# Patient Record
Sex: Male | Born: 1955 | Race: White | Hispanic: No | Marital: Single | Smoking: Never smoker
Health system: Southern US, Community
[De-identification: ages and names within clinical notes are randomized; demographics above are authoritative.]

## PROBLEM LIST (undated history)

## (undated) DIAGNOSIS — E119 Type 2 diabetes mellitus without complications: Secondary | ICD-10-CM

## (undated) DIAGNOSIS — Z8719 Personal history of other diseases of the digestive system: Secondary | ICD-10-CM

## (undated) DIAGNOSIS — Z9889 Other specified postprocedural states: Secondary | ICD-10-CM

## (undated) DIAGNOSIS — J45909 Unspecified asthma, uncomplicated: Secondary | ICD-10-CM

## (undated) DIAGNOSIS — K219 Gastro-esophageal reflux disease without esophagitis: Secondary | ICD-10-CM

## (undated) DIAGNOSIS — I10 Essential (primary) hypertension: Secondary | ICD-10-CM

## (undated) DIAGNOSIS — G4733 Obstructive sleep apnea (adult) (pediatric): Secondary | ICD-10-CM

## (undated) HISTORY — DX: Unspecified asthma, uncomplicated: J45.909

## (undated) HISTORY — DX: Essential (primary) hypertension: I10

## (undated) HISTORY — DX: Personal history of other diseases of the digestive system: Z87.19

## (undated) HISTORY — DX: Obstructive sleep apnea (adult) (pediatric): G47.33

## (undated) HISTORY — DX: Other specified postprocedural states: Z98.890

## (undated) HISTORY — DX: Type 2 diabetes mellitus without complications: E11.9

## (undated) HISTORY — DX: Gastro-esophageal reflux disease without esophagitis: K21.9

---

## 1996-08-29 HISTORY — PX: BACK SURGERY: SHX140

## 1998-10-30 ENCOUNTER — Ambulatory Visit (HOSPITAL_COMMUNITY): Admission: RE | Admit: 1998-10-30 | Discharge: 1998-10-30 | Payer: Self-pay | Admitting: Neurosurgery

## 1998-10-30 ENCOUNTER — Encounter: Payer: Self-pay | Admitting: Neurosurgery

## 2001-07-05 ENCOUNTER — Ambulatory Visit: Admission: RE | Admit: 2001-07-05 | Discharge: 2001-07-05 | Payer: Self-pay | Admitting: Pulmonary Disease

## 2001-07-05 ENCOUNTER — Encounter: Payer: Self-pay | Admitting: Pulmonary Disease

## 2001-11-27 ENCOUNTER — Emergency Department (HOSPITAL_COMMUNITY): Admission: EM | Admit: 2001-11-27 | Discharge: 2001-11-27 | Payer: Self-pay | Admitting: Emergency Medicine

## 2001-11-27 ENCOUNTER — Encounter: Payer: Self-pay | Admitting: Emergency Medicine

## 2005-08-29 HISTORY — PX: OTHER SURGICAL HISTORY: SHX169

## 2006-04-14 ENCOUNTER — Ambulatory Visit (HOSPITAL_COMMUNITY): Admission: RE | Admit: 2006-04-14 | Discharge: 2006-04-14 | Payer: Self-pay | Admitting: Neurosurgery

## 2007-06-25 IMAGING — CT CT L SPINE W/ CM
4 of 12 series · 10 of 33 positions shown, 11 images · non-contrast
Comparison: none

CLINICAL DATA: Fusion L4-5 and L5-S1.  Worsening back and hip pain.
 LUMBAR MYELOGRAM:
TECHNIQUE: Lumbar puncture was performed by Dr. Setzer.
TECHNIQUE: Multidetector CT imaging of the lumbar spine was performed after intrathecal injection of contrast.  Multiplanar CT image reconstructions were also generated.

[Series 2: l-spine helical · axial · 0.27mm/px · z∈[-316,-236]mm · 2 of 96 slices shown, 3 images]
[im 32/96  soft-tissue]
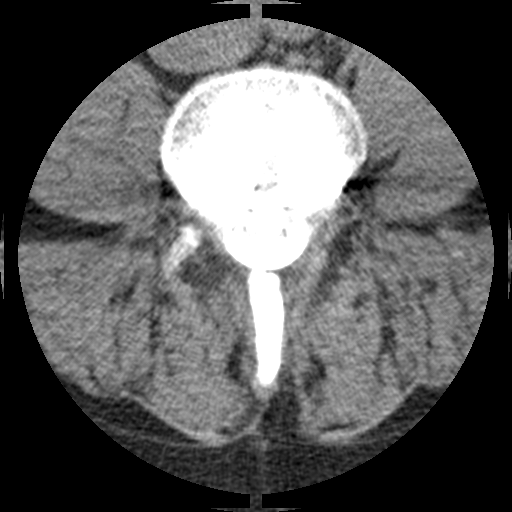
[im 32/96  bone]
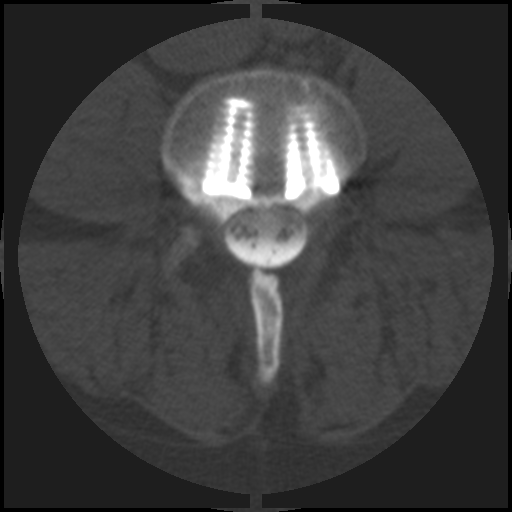
[im 64/96  bone]
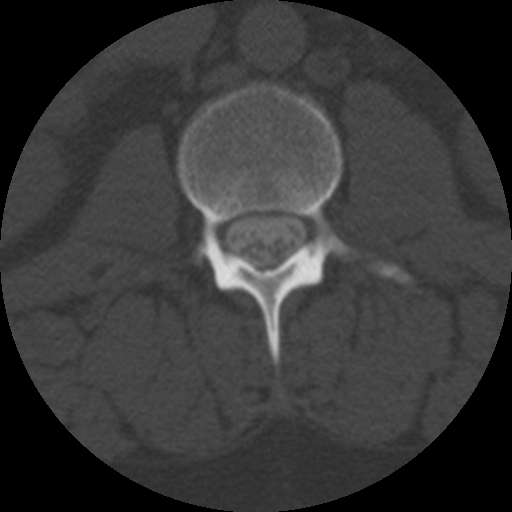

[Series 3: recon 2: l-spine helical · axial · 0.27mm/px · z∈[-316,-236]mm · 2 of 96 slices shown]
[im 32/96  bone]
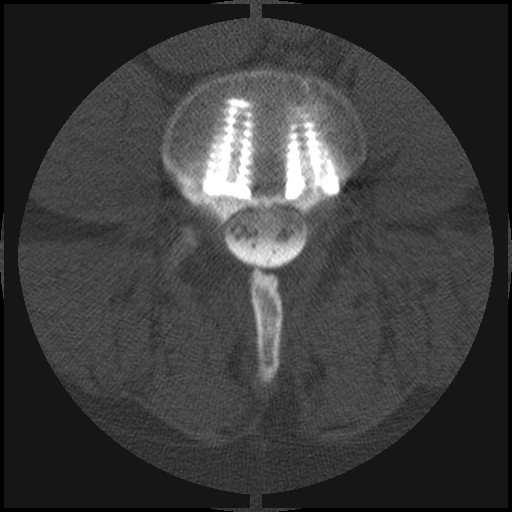
[im 64/96  bone]
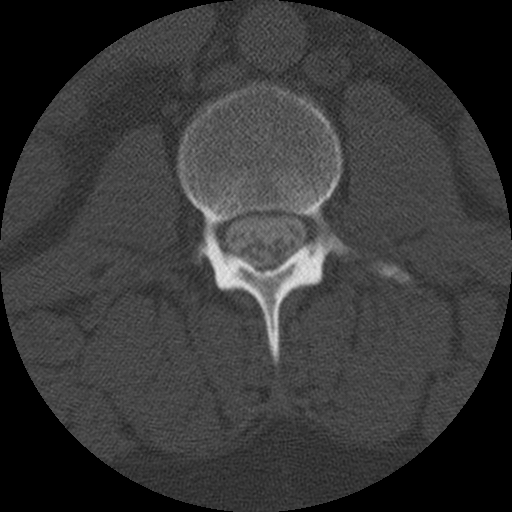

[Series 104: reformatted · coronal · 0.47mm/px · 3 of 67 slices shown (1 of 2)]
[im 17/67  bone]
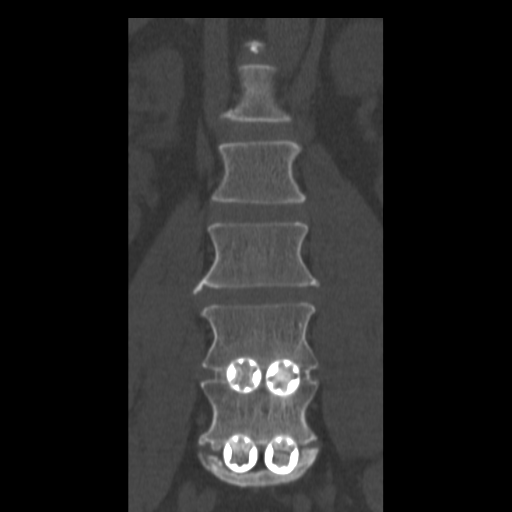
[im 34/67  bone]
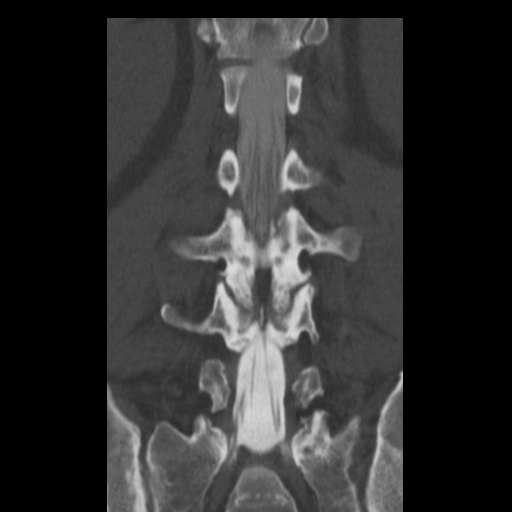
[im 50/67  bone]
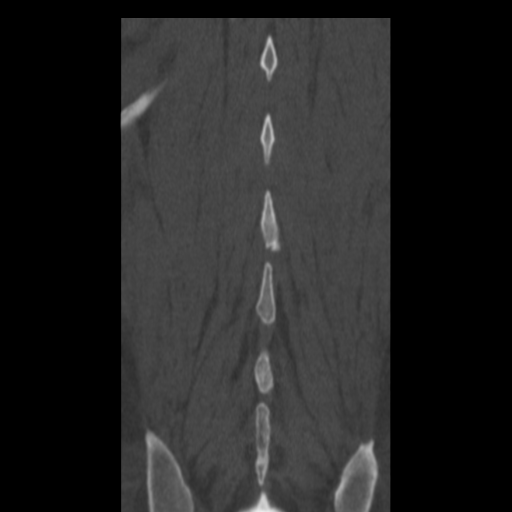

[Series 401: reformatted · coronal · 0.47mm/px · 3 of 56 slices shown (2 of 2)]
[im 12/56  bone]
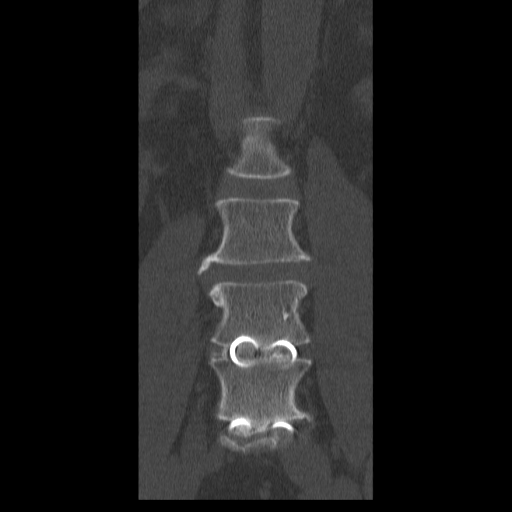
[im 23/56  bone]
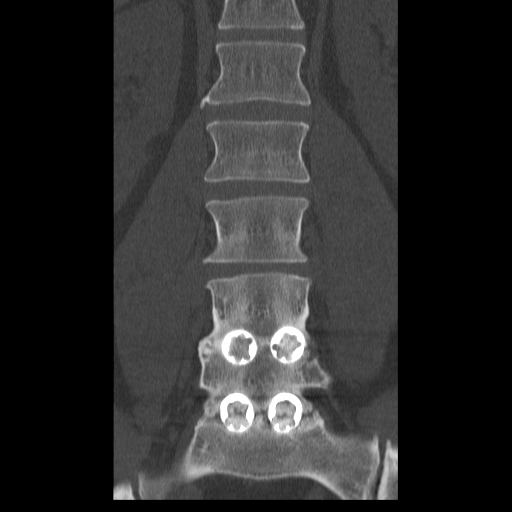
[im 34/56  bone]
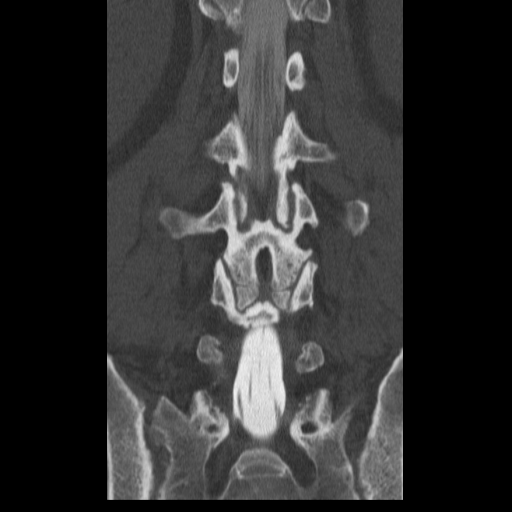

[10 of 33 positions shown; findings below may reference images not displayed]

FINDINGS: The patient has had cage fusion at L4-5 and L5-S1.  The spinal canal appears widely compressed at those levels.
 At L3-4, there is moderate stenosis, particularly affecting the lateral recesses, right more than left.  This appears to be due to encroachment from facet disease.
 Lesser facet degenerative changes are seen at L1-2 And L2-3 without definite stenosis.  
 Standing lateral flexion and extension views show perhaps slight rocking motion at the L3-4 level but are otherwise unremarkable.  The patient does not generate a great degree of bending.
IMPRESSION: 1.  Satisfactory appearance in fusion segment from L-4 to the sacrum.
 2.  Degenerative disk disease and degenerative facet disease at L3-4 with encroachment upon the lateral recesses, apparently primarily due to facet pathology.
 3.  Developing facet pathology also at L1-2 and L2-3, but without gross compressive stenosis.
 CT LUMBAR SPINE WITH CONTRAST (POST-MYELOGRAM):
FINDINGS: T12-L1:  Normal interspace.
 L1-2:  Disk appears normal. There is mild facet degeneration but no encroachment upon the canal or foramina.
 L2-3:  Minimal disk bulge.  Facet hypertrophy without encroachment upon the canal or foramina.
 L3-4:  The disk bulges moderately in a diffuse fashion.  There is facet overgrowth bilaterally with ligamentous calcification.  These findings are slightly more pronounced on the right. The lateral recesses are narrowed, more on the right than the left.  There is foraminal encroachment by facet osteophytes bilaterally.
 L4-5:  Solid fusion with wide patency of the canal and foramina.
 L5-S1:  Canal and foramina are widely patent. I can?t be sure there is solid fusion at this level.
IMPRESSION: 1.  Mild facet arthropathy at L1-2 and L2-3 but no stenosis
 2.  Marked facet arthropathy at L3-4 with facet overgrowth and ligamentous calcification having encroached upon the spinal canal. There is a moderate disk bulge as well.  Canal narrowing in the lateral recesses is more pronounced on the right. Neural foraminal encroachment bilaterally is about the same from right to left.
 3.  Good appearance of the fusion level of L4-5.
 4.  At the fusion level of L5-S1, there is wide patency of the canal and foramina.  I can?t be certain that there is solid fusion however.
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
## 2009-03-21 ENCOUNTER — Encounter: Payer: Self-pay | Admitting: Cardiology

## 2009-03-26 ENCOUNTER — Encounter: Payer: Self-pay | Admitting: Cardiology

## 2009-03-27 ENCOUNTER — Ambulatory Visit: Payer: Self-pay | Admitting: Cardiology

## 2009-04-02 ENCOUNTER — Encounter: Payer: Self-pay | Admitting: Cardiology

## 2009-05-18 ENCOUNTER — Ambulatory Visit: Payer: Self-pay | Admitting: Cardiology

## 2009-05-18 DIAGNOSIS — I10 Essential (primary) hypertension: Secondary | ICD-10-CM

## 2009-05-18 DIAGNOSIS — G4730 Sleep apnea, unspecified: Secondary | ICD-10-CM | POA: Insufficient documentation

## 2009-05-18 DIAGNOSIS — K219 Gastro-esophageal reflux disease without esophagitis: Secondary | ICD-10-CM

## 2009-05-18 DIAGNOSIS — J45909 Unspecified asthma, uncomplicated: Secondary | ICD-10-CM | POA: Insufficient documentation

## 2009-05-18 DIAGNOSIS — R072 Precordial pain: Secondary | ICD-10-CM | POA: Insufficient documentation

## 2009-05-18 DIAGNOSIS — E119 Type 2 diabetes mellitus without complications: Secondary | ICD-10-CM

## 2009-05-18 DIAGNOSIS — E669 Obesity, unspecified: Secondary | ICD-10-CM | POA: Insufficient documentation

## 2009-05-18 DIAGNOSIS — R9430 Abnormal result of cardiovascular function study, unspecified: Secondary | ICD-10-CM | POA: Insufficient documentation

## 2009-05-21 ENCOUNTER — Ambulatory Visit: Payer: Self-pay | Admitting: Cardiology

## 2009-05-21 ENCOUNTER — Encounter: Payer: Self-pay | Admitting: Cardiology

## 2009-05-25 ENCOUNTER — Encounter (INDEPENDENT_AMBULATORY_CARE_PROVIDER_SITE_OTHER): Payer: Self-pay | Admitting: *Deleted

## 2009-05-27 ENCOUNTER — Encounter: Payer: Self-pay | Admitting: Cardiology

## 2009-07-22 ENCOUNTER — Telehealth (INDEPENDENT_AMBULATORY_CARE_PROVIDER_SITE_OTHER): Payer: Self-pay | Admitting: *Deleted

## 2012-02-10 ENCOUNTER — Other Ambulatory Visit: Payer: Self-pay | Admitting: Cardiology

## 2012-02-10 ENCOUNTER — Encounter: Payer: Self-pay | Admitting: Cardiology

## 2012-03-14 ENCOUNTER — Encounter: Payer: Self-pay | Admitting: Cardiology

## 2012-04-24 ENCOUNTER — Encounter: Payer: Self-pay | Admitting: Cardiology

## 2012-04-24 ENCOUNTER — Telehealth: Payer: Self-pay

## 2012-04-24 ENCOUNTER — Encounter: Payer: Self-pay | Admitting: *Deleted

## 2012-04-24 ENCOUNTER — Other Ambulatory Visit: Payer: Self-pay | Admitting: Cardiology

## 2012-04-24 ENCOUNTER — Ambulatory Visit (INDEPENDENT_AMBULATORY_CARE_PROVIDER_SITE_OTHER): Payer: PRIVATE HEALTH INSURANCE | Admitting: Cardiology

## 2012-04-24 VITALS — BP 105/65 | HR 95 | Ht 71.0 in | Wt 277.0 lb

## 2012-04-24 DIAGNOSIS — R079 Chest pain, unspecified: Secondary | ICD-10-CM

## 2012-04-24 DIAGNOSIS — G4730 Sleep apnea, unspecified: Secondary | ICD-10-CM

## 2012-04-24 DIAGNOSIS — R072 Precordial pain: Secondary | ICD-10-CM

## 2012-04-24 DIAGNOSIS — I10 Essential (primary) hypertension: Secondary | ICD-10-CM

## 2012-04-24 NOTE — Progress Notes (Signed)
Clinical Summary [name] is a 56 y.o.male referred for cardiology consultation by Dr. Neita Bautista. He is a former patient of Dr. Antoine Bautista, last seen in 2010.   Previous cardiac testing includes an exercise Cardiolite from 2010 demonstrated no diagnostic ST segment changes with hypertensive response, hands probable normal perfusion with soft tissue attenuation, LVEF 46%. Echocardiography at that point however demonstrated normal LVEF of 55-60% with diastolic dysfunction.  He is referred due to an episode of chest pain that occurred back in June prompting an ER visit. He states that he had been out of his acid reducing medication, thought that it might be indigestion but was not sure. Troponin and BNP levels were normal. Chest x-ray reported no acute abnormalities. ECG showed sinus rhythm with no definite acute ST segment abnormalities in the setting of lead motion.  He states that symptoms have improved since then, particularly the last few weeks. He is now on Protonix.  He works [employer], walks outdoors frequently without recurring chest pain symptoms and NYHA class II dyspnea. He is still trying to work towards getting regular CPAP therapy at home.   Allergies  Allergen Reactions  . Penicillins     REACTION: hives  . Tape     Surgical tape    Current Outpatient Prescriptions  Medication Sig Dispense Refill  . ALPRAZolam (XANAX) 1 MG tablet Take 1 mg by mouth 4 (four) times daily as needed.       Marland Kitchen aspirin 81 MG tablet Take 81 mg by mouth daily.      . finasteride (PROSCAR) 5 MG tablet Take 1 tablet by mouth Daily.      Marland Kitchen HYDROcodone-acetaminophen (NORCO) 10-325 MG per tablet Take 1 tablet by mouth every 6 (six) hours as needed.      Marland Kitchen lisinopril (PRINIVIL,ZESTRIL) 10 MG tablet Take 1 tablet by mouth Daily.      . metFORMIN (GLUCOPHAGE) 500 MG tablet Take 1,500 mg by mouth daily with breakfast.       . nabumetone (RELAFEN) 750 MG tablet Take 1 tablet by mouth Twice daily.      Marland Kitchen  NITROSTAT 0.4 MG SL tablet Place 1 tablet under the tongue every 5 (five) minutes x 3 doses as needed.      Marland Kitchen omeprazole (PRILOSEC) 20 MG capsule Take 20 mg by mouth daily.      Marland Kitchen terazosin (HYTRIN) 10 MG capsule Take 10 mg by mouth at bedtime.        Past Medical History  Diagnosis Date  . Asthma   . Essential hypertension, benign   . Type 2 diabetes mellitus   . GERD (gastroesophageal reflux disease)   . S/P dilatation of esophageal stricture   . OSA (obstructive sleep apnea)     Past Surgical History  Procedure Date  . Right thr 2007  . Back surgery 1998    Family History  Problem Relation Age of Onset  . Heart failure Father   . Cancer Father   . Valvular heart disease Mother     Social History [name] reports that he has never smoked. He has never used smokeless tobacco. [name] reports that he does not drink alcohol.  Review of Systems No palpitations, orthopnea, PND, syncope. Has occasional leg pain, not claudication by description. Otherwise negative.  Physical Examination Filed Vitals:   04/24/12 1500  BP: 105/65  Pulse: 95   Obese male in no acute distress. HEENT: Conjunctiva and lids normal, oropharynx clear. Neck: Supple, increased girth,  no obvious elevated JVP or carotid bruits, no thyromegaly. Lungs: Clear to auscultation, decreased BS, nonlabored breathing at rest. Cardiac: Regular rate and rhythm, no S3 or significant systolic murmur, no pericardial rub. Abdomen: Soft, protuberant, nontender, bowel sounds present, no guarding or rebound. Extremities: Trace edema, distal pulses 2+. Skin: Warm and dry. Musculoskeletal: No kyphosis. Neuropsychiatric: Alert and oriented x3, affect grossly appropriate.   Problem List and Plan   PRECORDIAL PAIN Possibly reflux based on description and resolution on PPI. He does have ongoing cardiac risk factors however including obesity, diabetes, hyperlipidemia, and hypertension. No objective ischemic testing in  the last 3 years. Recent ECG is nonspecific with normal cardiac markers. We will arrange an exercise echocardiogram for further evaluation and inform him of the results.  Essential hypertension, benign Blood pressure is normal today.  SLEEP APNEA I encouraged him to contact Dr. Neita Bautista so than his CPAP is functional and ready to use.    Alex Bautista, M.D., F.A.C.C.

## 2012-04-24 NOTE — Telephone Encounter (Signed)
Stress Echo scheduled for 05-02-12 @ [facility] Checking percert

## 2012-04-24 NOTE — Patient Instructions (Addendum)
Your physician recommends that you continue on your current medications as directed. Please refer to the Current Medication list given to you today.  Your physician has requested that you have a stress echocardiogram. For further information please visit [email or web address] Please follow instruction sheet as given. We will call you with the results.

## 2012-04-24 NOTE — Assessment & Plan Note (Signed)
I encouraged him to contact Dr. Neita Carp so than his CPAP is functional and ready to use.

## 2012-04-24 NOTE — Assessment & Plan Note (Signed)
Possibly reflux based on description and resolution on PPI. He does have ongoing cardiac risk factors however including obesity, diabetes, hyperlipidemia, and hypertension. No objective ischemic testing in the last 3 years. Recent ECG is nonspecific with normal cardiac markers. We will arrange an exercise echocardiogram for further evaluation and inform him of the results.

## 2012-04-24 NOTE — Assessment & Plan Note (Signed)
Blood pressure is normal today. 

## 2012-04-25 NOTE — Telephone Encounter (Signed)
Pt has [employer] PFFS.  Per Jan H, [phone], no precert required.

## 2012-05-02 DIAGNOSIS — R072 Precordial pain: Secondary | ICD-10-CM

## 2012-05-10 ENCOUNTER — Telehealth: Payer: Self-pay | Admitting: *Deleted

## 2012-05-10 NOTE — Telephone Encounter (Signed)
Patient informed. 

## 2012-05-10 NOTE — Telephone Encounter (Signed)
Message copied by Eustace Moore on Thu May 10, 2012  1:17 PM ------      Message from: MCDOWELL, Illene Bolus      Created: Thu May 10, 2012  8:21 AM       Reviewed. Reassuring study without definite evidence of obstructive CAD.

## 2014-09-09 DIAGNOSIS — R252 Cramp and spasm: Secondary | ICD-10-CM | POA: Diagnosis not present

## 2014-10-14 DIAGNOSIS — F333 Major depressive disorder, recurrent, severe with psychotic symptoms: Secondary | ICD-10-CM | POA: Diagnosis not present

## 2014-10-14 DIAGNOSIS — Z5181 Encounter for therapeutic drug level monitoring: Secondary | ICD-10-CM | POA: Diagnosis not present

## 2014-11-06 DIAGNOSIS — I10 Essential (primary) hypertension: Secondary | ICD-10-CM | POA: Diagnosis not present

## 2014-11-06 DIAGNOSIS — E1165 Type 2 diabetes mellitus with hyperglycemia: Secondary | ICD-10-CM | POA: Diagnosis not present

## 2014-11-06 DIAGNOSIS — E782 Mixed hyperlipidemia: Secondary | ICD-10-CM | POA: Diagnosis not present

## 2014-11-06 DIAGNOSIS — K210 Gastro-esophageal reflux disease with esophagitis: Secondary | ICD-10-CM | POA: Diagnosis not present

## 2014-11-13 DIAGNOSIS — G4733 Obstructive sleep apnea (adult) (pediatric): Secondary | ICD-10-CM | POA: Diagnosis not present

## 2014-11-13 DIAGNOSIS — Z1389 Encounter for screening for other disorder: Secondary | ICD-10-CM | POA: Diagnosis not present

## 2014-11-13 DIAGNOSIS — E1165 Type 2 diabetes mellitus with hyperglycemia: Secondary | ICD-10-CM | POA: Diagnosis not present

## 2014-11-13 DIAGNOSIS — E782 Mixed hyperlipidemia: Secondary | ICD-10-CM | POA: Diagnosis not present

## 2014-11-13 DIAGNOSIS — M545 Low back pain: Secondary | ICD-10-CM | POA: Diagnosis not present

## 2014-11-13 DIAGNOSIS — R252 Cramp and spasm: Secondary | ICD-10-CM | POA: Diagnosis not present

## 2014-11-13 DIAGNOSIS — R911 Solitary pulmonary nodule: Secondary | ICD-10-CM | POA: Diagnosis not present

## 2014-11-13 DIAGNOSIS — I10 Essential (primary) hypertension: Secondary | ICD-10-CM | POA: Diagnosis not present

## 2015-01-29 DIAGNOSIS — H10022 Other mucopurulent conjunctivitis, left eye: Secondary | ICD-10-CM | POA: Diagnosis not present

## 2015-01-29 DIAGNOSIS — M79671 Pain in right foot: Secondary | ICD-10-CM | POA: Diagnosis not present

## 2015-01-29 DIAGNOSIS — M25562 Pain in left knee: Secondary | ICD-10-CM | POA: Diagnosis not present

## 2015-02-04 DIAGNOSIS — F319 Bipolar disorder, unspecified: Secondary | ICD-10-CM | POA: Diagnosis not present

## 2015-03-16 DIAGNOSIS — I10 Essential (primary) hypertension: Secondary | ICD-10-CM | POA: Diagnosis not present

## 2015-03-16 DIAGNOSIS — E780 Pure hypercholesterolemia: Secondary | ICD-10-CM | POA: Diagnosis not present

## 2015-03-16 DIAGNOSIS — E782 Mixed hyperlipidemia: Secondary | ICD-10-CM | POA: Diagnosis not present

## 2015-03-16 DIAGNOSIS — E1165 Type 2 diabetes mellitus with hyperglycemia: Secondary | ICD-10-CM | POA: Diagnosis not present

## 2015-03-23 DIAGNOSIS — R252 Cramp and spasm: Secondary | ICD-10-CM | POA: Diagnosis not present

## 2015-03-23 DIAGNOSIS — R911 Solitary pulmonary nodule: Secondary | ICD-10-CM | POA: Diagnosis not present

## 2015-03-23 DIAGNOSIS — I10 Essential (primary) hypertension: Secondary | ICD-10-CM | POA: Diagnosis not present

## 2015-03-23 DIAGNOSIS — M545 Low back pain: Secondary | ICD-10-CM | POA: Diagnosis not present

## 2015-03-23 DIAGNOSIS — Z23 Encounter for immunization: Secondary | ICD-10-CM | POA: Diagnosis not present

## 2015-03-23 DIAGNOSIS — G4733 Obstructive sleep apnea (adult) (pediatric): Secondary | ICD-10-CM | POA: Diagnosis not present

## 2015-03-23 DIAGNOSIS — E782 Mixed hyperlipidemia: Secondary | ICD-10-CM | POA: Diagnosis not present

## 2015-07-20 DIAGNOSIS — F411 Generalized anxiety disorder: Secondary | ICD-10-CM | POA: Diagnosis not present

## 2015-07-20 DIAGNOSIS — E1129 Type 2 diabetes mellitus with other diabetic kidney complication: Secondary | ICD-10-CM | POA: Diagnosis not present

## 2015-07-20 DIAGNOSIS — E782 Mixed hyperlipidemia: Secondary | ICD-10-CM | POA: Diagnosis not present

## 2015-07-20 DIAGNOSIS — E7800 Pure hypercholesterolemia, unspecified: Secondary | ICD-10-CM | POA: Diagnosis not present

## 2015-07-20 DIAGNOSIS — I10 Essential (primary) hypertension: Secondary | ICD-10-CM | POA: Diagnosis not present

## 2015-07-27 DIAGNOSIS — M545 Low back pain: Secondary | ICD-10-CM | POA: Diagnosis not present

## 2015-07-27 DIAGNOSIS — G2581 Restless legs syndrome: Secondary | ICD-10-CM | POA: Diagnosis not present

## 2015-07-27 DIAGNOSIS — E782 Mixed hyperlipidemia: Secondary | ICD-10-CM | POA: Diagnosis not present

## 2015-07-27 DIAGNOSIS — G4733 Obstructive sleep apnea (adult) (pediatric): Secondary | ICD-10-CM | POA: Diagnosis not present

## 2015-07-27 DIAGNOSIS — I10 Essential (primary) hypertension: Secondary | ICD-10-CM | POA: Diagnosis not present

## 2015-07-27 DIAGNOSIS — R252 Cramp and spasm: Secondary | ICD-10-CM | POA: Diagnosis not present

## 2015-07-27 DIAGNOSIS — R911 Solitary pulmonary nodule: Secondary | ICD-10-CM | POA: Diagnosis not present

## 2015-08-05 DIAGNOSIS — F333 Major depressive disorder, recurrent, severe with psychotic symptoms: Secondary | ICD-10-CM | POA: Diagnosis not present

## 2015-10-21 DIAGNOSIS — I10 Essential (primary) hypertension: Secondary | ICD-10-CM | POA: Diagnosis not present

## 2015-10-21 DIAGNOSIS — E782 Mixed hyperlipidemia: Secondary | ICD-10-CM | POA: Diagnosis not present

## 2015-10-21 DIAGNOSIS — E1129 Type 2 diabetes mellitus with other diabetic kidney complication: Secondary | ICD-10-CM | POA: Diagnosis not present

## 2015-10-21 DIAGNOSIS — E7800 Pure hypercholesterolemia, unspecified: Secondary | ICD-10-CM | POA: Diagnosis not present

## 2015-10-26 DIAGNOSIS — R252 Cramp and spasm: Secondary | ICD-10-CM | POA: Diagnosis not present

## 2015-10-26 DIAGNOSIS — R911 Solitary pulmonary nodule: Secondary | ICD-10-CM | POA: Diagnosis not present

## 2015-10-26 DIAGNOSIS — F411 Generalized anxiety disorder: Secondary | ICD-10-CM | POA: Diagnosis not present

## 2015-10-26 DIAGNOSIS — G4733 Obstructive sleep apnea (adult) (pediatric): Secondary | ICD-10-CM | POA: Diagnosis not present

## 2015-10-26 DIAGNOSIS — I10 Essential (primary) hypertension: Secondary | ICD-10-CM | POA: Diagnosis not present

## 2015-10-26 DIAGNOSIS — M545 Low back pain: Secondary | ICD-10-CM | POA: Diagnosis not present

## 2015-10-26 DIAGNOSIS — E782 Mixed hyperlipidemia: Secondary | ICD-10-CM | POA: Diagnosis not present

## 2015-10-26 DIAGNOSIS — G2581 Restless legs syndrome: Secondary | ICD-10-CM | POA: Diagnosis not present

## 2016-02-11 DIAGNOSIS — F319 Bipolar disorder, unspecified: Secondary | ICD-10-CM | POA: Diagnosis not present

## 2016-03-02 DIAGNOSIS — E1165 Type 2 diabetes mellitus with hyperglycemia: Secondary | ICD-10-CM | POA: Diagnosis not present

## 2016-03-02 DIAGNOSIS — K210 Gastro-esophageal reflux disease with esophagitis: Secondary | ICD-10-CM | POA: Diagnosis not present

## 2016-03-02 DIAGNOSIS — E782 Mixed hyperlipidemia: Secondary | ICD-10-CM | POA: Diagnosis not present

## 2016-03-02 DIAGNOSIS — I10 Essential (primary) hypertension: Secondary | ICD-10-CM | POA: Diagnosis not present

## 2016-03-07 DIAGNOSIS — G4733 Obstructive sleep apnea (adult) (pediatric): Secondary | ICD-10-CM | POA: Diagnosis not present

## 2016-03-07 DIAGNOSIS — Z6841 Body Mass Index (BMI) 40.0 and over, adult: Secondary | ICD-10-CM | POA: Diagnosis not present

## 2016-03-07 DIAGNOSIS — R911 Solitary pulmonary nodule: Secondary | ICD-10-CM | POA: Diagnosis not present

## 2016-03-07 DIAGNOSIS — E782 Mixed hyperlipidemia: Secondary | ICD-10-CM | POA: Diagnosis not present

## 2016-03-07 DIAGNOSIS — M545 Low back pain: Secondary | ICD-10-CM | POA: Diagnosis not present

## 2016-03-07 DIAGNOSIS — R252 Cramp and spasm: Secondary | ICD-10-CM | POA: Diagnosis not present

## 2016-03-07 DIAGNOSIS — I10 Essential (primary) hypertension: Secondary | ICD-10-CM | POA: Diagnosis not present

## 2016-06-01 DIAGNOSIS — E7800 Pure hypercholesterolemia, unspecified: Secondary | ICD-10-CM | POA: Diagnosis not present

## 2016-06-01 DIAGNOSIS — E1129 Type 2 diabetes mellitus with other diabetic kidney complication: Secondary | ICD-10-CM | POA: Diagnosis not present

## 2016-06-01 DIAGNOSIS — E782 Mixed hyperlipidemia: Secondary | ICD-10-CM | POA: Diagnosis not present

## 2016-06-01 DIAGNOSIS — F411 Generalized anxiety disorder: Secondary | ICD-10-CM | POA: Diagnosis not present

## 2016-06-01 DIAGNOSIS — K210 Gastro-esophageal reflux disease with esophagitis: Secondary | ICD-10-CM | POA: Diagnosis not present

## 2016-06-01 DIAGNOSIS — E1140 Type 2 diabetes mellitus with diabetic neuropathy, unspecified: Secondary | ICD-10-CM | POA: Diagnosis not present

## 2016-06-06 DIAGNOSIS — F411 Generalized anxiety disorder: Secondary | ICD-10-CM | POA: Diagnosis not present

## 2016-06-06 DIAGNOSIS — E782 Mixed hyperlipidemia: Secondary | ICD-10-CM | POA: Diagnosis not present

## 2016-06-06 DIAGNOSIS — M545 Low back pain: Secondary | ICD-10-CM | POA: Diagnosis not present

## 2016-06-06 DIAGNOSIS — G4733 Obstructive sleep apnea (adult) (pediatric): Secondary | ICD-10-CM | POA: Diagnosis not present

## 2016-06-06 DIAGNOSIS — R252 Cramp and spasm: Secondary | ICD-10-CM | POA: Diagnosis not present

## 2016-06-06 DIAGNOSIS — G2581 Restless legs syndrome: Secondary | ICD-10-CM | POA: Diagnosis not present

## 2016-06-06 DIAGNOSIS — R911 Solitary pulmonary nodule: Secondary | ICD-10-CM | POA: Diagnosis not present

## 2016-06-06 DIAGNOSIS — I10 Essential (primary) hypertension: Secondary | ICD-10-CM | POA: Diagnosis not present

## 2016-06-22 DIAGNOSIS — F319 Bipolar disorder, unspecified: Secondary | ICD-10-CM | POA: Diagnosis not present

## 2016-08-25 DIAGNOSIS — I10 Essential (primary) hypertension: Secondary | ICD-10-CM | POA: Diagnosis not present

## 2016-08-25 DIAGNOSIS — E782 Mixed hyperlipidemia: Secondary | ICD-10-CM | POA: Diagnosis not present

## 2016-08-25 DIAGNOSIS — K210 Gastro-esophageal reflux disease with esophagitis: Secondary | ICD-10-CM | POA: Diagnosis not present

## 2016-08-25 DIAGNOSIS — E1165 Type 2 diabetes mellitus with hyperglycemia: Secondary | ICD-10-CM | POA: Diagnosis not present

## 2016-08-30 DIAGNOSIS — M545 Low back pain: Secondary | ICD-10-CM | POA: Diagnosis not present

## 2016-08-30 DIAGNOSIS — R911 Solitary pulmonary nodule: Secondary | ICD-10-CM | POA: Diagnosis not present

## 2016-08-30 DIAGNOSIS — G4733 Obstructive sleep apnea (adult) (pediatric): Secondary | ICD-10-CM | POA: Diagnosis not present

## 2016-08-30 DIAGNOSIS — E782 Mixed hyperlipidemia: Secondary | ICD-10-CM | POA: Diagnosis not present

## 2016-08-30 DIAGNOSIS — I10 Essential (primary) hypertension: Secondary | ICD-10-CM | POA: Diagnosis not present

## 2016-08-30 DIAGNOSIS — R252 Cramp and spasm: Secondary | ICD-10-CM | POA: Diagnosis not present

## 2016-08-30 DIAGNOSIS — Z6841 Body Mass Index (BMI) 40.0 and over, adult: Secondary | ICD-10-CM | POA: Diagnosis not present

## 2016-11-28 DIAGNOSIS — E1165 Type 2 diabetes mellitus with hyperglycemia: Secondary | ICD-10-CM | POA: Diagnosis not present

## 2016-11-28 DIAGNOSIS — K210 Gastro-esophageal reflux disease with esophagitis: Secondary | ICD-10-CM | POA: Diagnosis not present

## 2016-11-28 DIAGNOSIS — I10 Essential (primary) hypertension: Secondary | ICD-10-CM | POA: Diagnosis not present

## 2016-11-28 DIAGNOSIS — E782 Mixed hyperlipidemia: Secondary | ICD-10-CM | POA: Diagnosis not present

## 2016-11-30 DIAGNOSIS — R252 Cramp and spasm: Secondary | ICD-10-CM | POA: Diagnosis not present

## 2016-11-30 DIAGNOSIS — I10 Essential (primary) hypertension: Secondary | ICD-10-CM | POA: Diagnosis not present

## 2016-11-30 DIAGNOSIS — R911 Solitary pulmonary nodule: Secondary | ICD-10-CM | POA: Diagnosis not present

## 2016-11-30 DIAGNOSIS — M545 Low back pain: Secondary | ICD-10-CM | POA: Diagnosis not present

## 2016-11-30 DIAGNOSIS — G4733 Obstructive sleep apnea (adult) (pediatric): Secondary | ICD-10-CM | POA: Diagnosis not present

## 2016-11-30 DIAGNOSIS — G2581 Restless legs syndrome: Secondary | ICD-10-CM | POA: Diagnosis not present

## 2016-11-30 DIAGNOSIS — E782 Mixed hyperlipidemia: Secondary | ICD-10-CM | POA: Diagnosis not present

## 2017-02-13 DIAGNOSIS — Z6841 Body Mass Index (BMI) 40.0 and over, adult: Secondary | ICD-10-CM | POA: Diagnosis not present

## 2017-02-13 DIAGNOSIS — L259 Unspecified contact dermatitis, unspecified cause: Secondary | ICD-10-CM | POA: Diagnosis not present

## 2017-02-13 DIAGNOSIS — B372 Candidiasis of skin and nail: Secondary | ICD-10-CM | POA: Diagnosis not present

## 2017-02-21 DIAGNOSIS — G4733 Obstructive sleep apnea (adult) (pediatric): Secondary | ICD-10-CM | POA: Diagnosis not present

## 2017-02-21 DIAGNOSIS — E782 Mixed hyperlipidemia: Secondary | ICD-10-CM | POA: Diagnosis not present

## 2017-02-21 DIAGNOSIS — F329 Major depressive disorder, single episode, unspecified: Secondary | ICD-10-CM | POA: Diagnosis not present

## 2017-02-21 DIAGNOSIS — E1140 Type 2 diabetes mellitus with diabetic neuropathy, unspecified: Secondary | ICD-10-CM | POA: Diagnosis not present

## 2017-02-21 DIAGNOSIS — E7800 Pure hypercholesterolemia, unspecified: Secondary | ICD-10-CM | POA: Diagnosis not present

## 2017-02-21 DIAGNOSIS — E1165 Type 2 diabetes mellitus with hyperglycemia: Secondary | ICD-10-CM | POA: Diagnosis not present

## 2017-02-21 DIAGNOSIS — K210 Gastro-esophageal reflux disease with esophagitis: Secondary | ICD-10-CM | POA: Diagnosis not present

## 2017-02-21 DIAGNOSIS — E1129 Type 2 diabetes mellitus with other diabetic kidney complication: Secondary | ICD-10-CM | POA: Diagnosis not present

## 2017-02-24 DIAGNOSIS — R0789 Other chest pain: Secondary | ICD-10-CM | POA: Diagnosis not present

## 2017-02-24 DIAGNOSIS — Z7984 Long term (current) use of oral hypoglycemic drugs: Secondary | ICD-10-CM | POA: Diagnosis not present

## 2017-02-24 DIAGNOSIS — I10 Essential (primary) hypertension: Secondary | ICD-10-CM | POA: Diagnosis not present

## 2017-02-24 DIAGNOSIS — E119 Type 2 diabetes mellitus without complications: Secondary | ICD-10-CM | POA: Diagnosis not present

## 2017-02-24 DIAGNOSIS — R109 Unspecified abdominal pain: Secondary | ICD-10-CM | POA: Diagnosis not present

## 2017-02-24 DIAGNOSIS — R112 Nausea with vomiting, unspecified: Secondary | ICD-10-CM | POA: Diagnosis not present

## 2017-02-24 DIAGNOSIS — R079 Chest pain, unspecified: Secondary | ICD-10-CM | POA: Diagnosis not present

## 2017-02-24 DIAGNOSIS — R000 Tachycardia, unspecified: Secondary | ICD-10-CM | POA: Diagnosis not present

## 2017-02-24 DIAGNOSIS — R252 Cramp and spasm: Secondary | ICD-10-CM | POA: Diagnosis not present

## 2017-02-24 DIAGNOSIS — Z79899 Other long term (current) drug therapy: Secondary | ICD-10-CM | POA: Diagnosis not present

## 2017-02-27 DIAGNOSIS — Z6841 Body Mass Index (BMI) 40.0 and over, adult: Secondary | ICD-10-CM | POA: Diagnosis not present

## 2017-02-27 DIAGNOSIS — R252 Cramp and spasm: Secondary | ICD-10-CM | POA: Diagnosis not present

## 2017-02-27 DIAGNOSIS — E782 Mixed hyperlipidemia: Secondary | ICD-10-CM | POA: Diagnosis not present

## 2017-02-27 DIAGNOSIS — Z0000 Encounter for general adult medical examination without abnormal findings: Secondary | ICD-10-CM | POA: Diagnosis not present

## 2017-02-27 DIAGNOSIS — Z23 Encounter for immunization: Secondary | ICD-10-CM | POA: Diagnosis not present

## 2017-02-27 DIAGNOSIS — I10 Essential (primary) hypertension: Secondary | ICD-10-CM | POA: Diagnosis not present

## 2017-02-27 DIAGNOSIS — R911 Solitary pulmonary nodule: Secondary | ICD-10-CM | POA: Diagnosis not present

## 2017-05-18 DIAGNOSIS — F333 Major depressive disorder, recurrent, severe with psychotic symptoms: Secondary | ICD-10-CM | POA: Diagnosis not present

## 2017-06-07 DIAGNOSIS — E1140 Type 2 diabetes mellitus with diabetic neuropathy, unspecified: Secondary | ICD-10-CM | POA: Diagnosis not present

## 2017-06-07 DIAGNOSIS — G4733 Obstructive sleep apnea (adult) (pediatric): Secondary | ICD-10-CM | POA: Diagnosis not present

## 2017-06-07 DIAGNOSIS — E782 Mixed hyperlipidemia: Secondary | ICD-10-CM | POA: Diagnosis not present

## 2017-06-07 DIAGNOSIS — K210 Gastro-esophageal reflux disease with esophagitis: Secondary | ICD-10-CM | POA: Diagnosis not present

## 2017-06-07 DIAGNOSIS — G2581 Restless legs syndrome: Secondary | ICD-10-CM | POA: Diagnosis not present

## 2017-06-07 DIAGNOSIS — E7800 Pure hypercholesterolemia, unspecified: Secondary | ICD-10-CM | POA: Diagnosis not present

## 2017-06-07 DIAGNOSIS — E1129 Type 2 diabetes mellitus with other diabetic kidney complication: Secondary | ICD-10-CM | POA: Diagnosis not present

## 2017-06-07 DIAGNOSIS — I10 Essential (primary) hypertension: Secondary | ICD-10-CM | POA: Diagnosis not present

## 2017-06-07 DIAGNOSIS — E1165 Type 2 diabetes mellitus with hyperglycemia: Secondary | ICD-10-CM | POA: Diagnosis not present

## 2017-06-07 DIAGNOSIS — F411 Generalized anxiety disorder: Secondary | ICD-10-CM | POA: Diagnosis not present

## 2017-06-14 DIAGNOSIS — M545 Low back pain: Secondary | ICD-10-CM | POA: Diagnosis not present

## 2017-06-14 DIAGNOSIS — G2581 Restless legs syndrome: Secondary | ICD-10-CM | POA: Diagnosis not present

## 2017-06-14 DIAGNOSIS — Z6841 Body Mass Index (BMI) 40.0 and over, adult: Secondary | ICD-10-CM | POA: Diagnosis not present

## 2017-06-14 DIAGNOSIS — I10 Essential (primary) hypertension: Secondary | ICD-10-CM | POA: Diagnosis not present

## 2017-06-14 DIAGNOSIS — R252 Cramp and spasm: Secondary | ICD-10-CM | POA: Diagnosis not present

## 2017-06-14 DIAGNOSIS — R911 Solitary pulmonary nodule: Secondary | ICD-10-CM | POA: Diagnosis not present

## 2017-06-14 DIAGNOSIS — Z23 Encounter for immunization: Secondary | ICD-10-CM | POA: Diagnosis not present

## 2017-06-14 DIAGNOSIS — E782 Mixed hyperlipidemia: Secondary | ICD-10-CM | POA: Diagnosis not present

## 2017-09-11 DIAGNOSIS — E782 Mixed hyperlipidemia: Secondary | ICD-10-CM | POA: Diagnosis not present

## 2017-09-11 DIAGNOSIS — E1165 Type 2 diabetes mellitus with hyperglycemia: Secondary | ICD-10-CM | POA: Diagnosis not present

## 2017-09-11 DIAGNOSIS — I10 Essential (primary) hypertension: Secondary | ICD-10-CM | POA: Diagnosis not present

## 2017-09-11 DIAGNOSIS — K210 Gastro-esophageal reflux disease with esophagitis: Secondary | ICD-10-CM | POA: Diagnosis not present

## 2017-09-13 DIAGNOSIS — M545 Low back pain: Secondary | ICD-10-CM | POA: Diagnosis not present

## 2017-09-13 DIAGNOSIS — G4733 Obstructive sleep apnea (adult) (pediatric): Secondary | ICD-10-CM | POA: Diagnosis not present

## 2017-09-13 DIAGNOSIS — R911 Solitary pulmonary nodule: Secondary | ICD-10-CM | POA: Diagnosis not present

## 2017-09-13 DIAGNOSIS — I10 Essential (primary) hypertension: Secondary | ICD-10-CM | POA: Diagnosis not present

## 2017-09-13 DIAGNOSIS — E782 Mixed hyperlipidemia: Secondary | ICD-10-CM | POA: Diagnosis not present

## 2017-09-13 DIAGNOSIS — Z6841 Body Mass Index (BMI) 40.0 and over, adult: Secondary | ICD-10-CM | POA: Diagnosis not present

## 2017-09-13 DIAGNOSIS — R252 Cramp and spasm: Secondary | ICD-10-CM | POA: Diagnosis not present

## 2017-09-28 DIAGNOSIS — F333 Major depressive disorder, recurrent, severe with psychotic symptoms: Secondary | ICD-10-CM | POA: Diagnosis not present

## 2017-12-06 DIAGNOSIS — E782 Mixed hyperlipidemia: Secondary | ICD-10-CM | POA: Diagnosis not present

## 2017-12-06 DIAGNOSIS — G2581 Restless legs syndrome: Secondary | ICD-10-CM | POA: Diagnosis not present

## 2017-12-06 DIAGNOSIS — E1129 Type 2 diabetes mellitus with other diabetic kidney complication: Secondary | ICD-10-CM | POA: Diagnosis not present

## 2017-12-06 DIAGNOSIS — E7800 Pure hypercholesterolemia, unspecified: Secondary | ICD-10-CM | POA: Diagnosis not present

## 2017-12-06 DIAGNOSIS — F411 Generalized anxiety disorder: Secondary | ICD-10-CM | POA: Diagnosis not present

## 2017-12-06 DIAGNOSIS — E1140 Type 2 diabetes mellitus with diabetic neuropathy, unspecified: Secondary | ICD-10-CM | POA: Diagnosis not present

## 2017-12-06 DIAGNOSIS — G4733 Obstructive sleep apnea (adult) (pediatric): Secondary | ICD-10-CM | POA: Diagnosis not present

## 2017-12-06 DIAGNOSIS — K210 Gastro-esophageal reflux disease with esophagitis: Secondary | ICD-10-CM | POA: Diagnosis not present

## 2017-12-06 DIAGNOSIS — E1165 Type 2 diabetes mellitus with hyperglycemia: Secondary | ICD-10-CM | POA: Diagnosis not present

## 2017-12-06 DIAGNOSIS — I10 Essential (primary) hypertension: Secondary | ICD-10-CM | POA: Diagnosis not present

## 2017-12-12 DIAGNOSIS — E782 Mixed hyperlipidemia: Secondary | ICD-10-CM | POA: Diagnosis not present

## 2017-12-12 DIAGNOSIS — I10 Essential (primary) hypertension: Secondary | ICD-10-CM | POA: Diagnosis not present

## 2017-12-12 DIAGNOSIS — R252 Cramp and spasm: Secondary | ICD-10-CM | POA: Diagnosis not present

## 2017-12-12 DIAGNOSIS — G4733 Obstructive sleep apnea (adult) (pediatric): Secondary | ICD-10-CM | POA: Diagnosis not present

## 2017-12-12 DIAGNOSIS — M545 Low back pain: Secondary | ICD-10-CM | POA: Diagnosis not present

## 2017-12-12 DIAGNOSIS — R911 Solitary pulmonary nodule: Secondary | ICD-10-CM | POA: Diagnosis not present

## 2017-12-12 DIAGNOSIS — Z6841 Body Mass Index (BMI) 40.0 and over, adult: Secondary | ICD-10-CM | POA: Diagnosis not present

## 2018-01-31 DIAGNOSIS — S46811A Strain of other muscles, fascia and tendons at shoulder and upper arm level, right arm, initial encounter: Secondary | ICD-10-CM | POA: Diagnosis not present

## 2018-01-31 DIAGNOSIS — Z6841 Body Mass Index (BMI) 40.0 and over, adult: Secondary | ICD-10-CM | POA: Diagnosis not present

## 2018-03-07 DIAGNOSIS — K210 Gastro-esophageal reflux disease with esophagitis: Secondary | ICD-10-CM | POA: Diagnosis not present

## 2018-03-07 DIAGNOSIS — E7800 Pure hypercholesterolemia, unspecified: Secondary | ICD-10-CM | POA: Diagnosis not present

## 2018-03-07 DIAGNOSIS — E1140 Type 2 diabetes mellitus with diabetic neuropathy, unspecified: Secondary | ICD-10-CM | POA: Diagnosis not present

## 2018-03-07 DIAGNOSIS — I10 Essential (primary) hypertension: Secondary | ICD-10-CM | POA: Diagnosis not present

## 2018-03-07 DIAGNOSIS — F411 Generalized anxiety disorder: Secondary | ICD-10-CM | POA: Diagnosis not present

## 2018-03-07 DIAGNOSIS — G4733 Obstructive sleep apnea (adult) (pediatric): Secondary | ICD-10-CM | POA: Diagnosis not present

## 2018-03-07 DIAGNOSIS — E1129 Type 2 diabetes mellitus with other diabetic kidney complication: Secondary | ICD-10-CM | POA: Diagnosis not present

## 2018-03-07 DIAGNOSIS — E1165 Type 2 diabetes mellitus with hyperglycemia: Secondary | ICD-10-CM | POA: Diagnosis not present

## 2018-03-07 DIAGNOSIS — F319 Bipolar disorder, unspecified: Secondary | ICD-10-CM | POA: Diagnosis not present

## 2018-03-07 DIAGNOSIS — E782 Mixed hyperlipidemia: Secondary | ICD-10-CM | POA: Diagnosis not present

## 2018-03-12 DIAGNOSIS — E1140 Type 2 diabetes mellitus with diabetic neuropathy, unspecified: Secondary | ICD-10-CM | POA: Diagnosis not present

## 2018-03-12 DIAGNOSIS — R252 Cramp and spasm: Secondary | ICD-10-CM | POA: Diagnosis not present

## 2018-03-12 DIAGNOSIS — G629 Polyneuropathy, unspecified: Secondary | ICD-10-CM | POA: Diagnosis not present

## 2018-03-12 DIAGNOSIS — E782 Mixed hyperlipidemia: Secondary | ICD-10-CM | POA: Diagnosis not present

## 2018-03-12 DIAGNOSIS — E1129 Type 2 diabetes mellitus with other diabetic kidney complication: Secondary | ICD-10-CM | POA: Diagnosis not present

## 2018-03-12 DIAGNOSIS — R911 Solitary pulmonary nodule: Secondary | ICD-10-CM | POA: Diagnosis not present

## 2018-03-12 DIAGNOSIS — I10 Essential (primary) hypertension: Secondary | ICD-10-CM | POA: Diagnosis not present

## 2018-03-12 DIAGNOSIS — Z0001 Encounter for general adult medical examination with abnormal findings: Secondary | ICD-10-CM | POA: Diagnosis not present

## 2018-03-12 DIAGNOSIS — R809 Proteinuria, unspecified: Secondary | ICD-10-CM | POA: Diagnosis not present

## 2018-05-14 DIAGNOSIS — Z79899 Other long term (current) drug therapy: Secondary | ICD-10-CM | POA: Diagnosis not present

## 2018-05-14 DIAGNOSIS — I10 Essential (primary) hypertension: Secondary | ICD-10-CM | POA: Diagnosis not present

## 2018-05-14 DIAGNOSIS — M79642 Pain in left hand: Secondary | ICD-10-CM | POA: Diagnosis not present

## 2018-05-14 DIAGNOSIS — E119 Type 2 diabetes mellitus without complications: Secondary | ICD-10-CM | POA: Diagnosis not present

## 2018-05-14 DIAGNOSIS — Z7984 Long term (current) use of oral hypoglycemic drugs: Secondary | ICD-10-CM | POA: Diagnosis not present

## 2018-05-14 DIAGNOSIS — S60222A Contusion of left hand, initial encounter: Secondary | ICD-10-CM | POA: Diagnosis not present

## 2018-06-06 DIAGNOSIS — E782 Mixed hyperlipidemia: Secondary | ICD-10-CM | POA: Diagnosis not present

## 2018-06-06 DIAGNOSIS — I10 Essential (primary) hypertension: Secondary | ICD-10-CM | POA: Diagnosis not present

## 2018-06-06 DIAGNOSIS — E1129 Type 2 diabetes mellitus with other diabetic kidney complication: Secondary | ICD-10-CM | POA: Diagnosis not present

## 2018-06-06 DIAGNOSIS — F3341 Major depressive disorder, recurrent, in partial remission: Secondary | ICD-10-CM | POA: Diagnosis not present

## 2018-06-06 DIAGNOSIS — G2581 Restless legs syndrome: Secondary | ICD-10-CM | POA: Diagnosis not present

## 2018-06-06 DIAGNOSIS — G4733 Obstructive sleep apnea (adult) (pediatric): Secondary | ICD-10-CM | POA: Diagnosis not present

## 2018-06-06 DIAGNOSIS — E7800 Pure hypercholesterolemia, unspecified: Secondary | ICD-10-CM | POA: Diagnosis not present

## 2018-06-06 DIAGNOSIS — K210 Gastro-esophageal reflux disease with esophagitis: Secondary | ICD-10-CM | POA: Diagnosis not present

## 2018-06-06 DIAGNOSIS — E1165 Type 2 diabetes mellitus with hyperglycemia: Secondary | ICD-10-CM | POA: Diagnosis not present

## 2018-06-06 DIAGNOSIS — E1140 Type 2 diabetes mellitus with diabetic neuropathy, unspecified: Secondary | ICD-10-CM | POA: Diagnosis not present

## 2018-06-12 DIAGNOSIS — I10 Essential (primary) hypertension: Secondary | ICD-10-CM | POA: Diagnosis not present

## 2018-06-12 DIAGNOSIS — E782 Mixed hyperlipidemia: Secondary | ICD-10-CM | POA: Diagnosis not present

## 2018-06-12 DIAGNOSIS — Z6841 Body Mass Index (BMI) 40.0 and over, adult: Secondary | ICD-10-CM | POA: Diagnosis not present

## 2018-06-12 DIAGNOSIS — R809 Proteinuria, unspecified: Secondary | ICD-10-CM | POA: Diagnosis not present

## 2018-06-12 DIAGNOSIS — E1129 Type 2 diabetes mellitus with other diabetic kidney complication: Secondary | ICD-10-CM | POA: Diagnosis not present

## 2018-06-12 DIAGNOSIS — R911 Solitary pulmonary nodule: Secondary | ICD-10-CM | POA: Diagnosis not present

## 2018-06-12 DIAGNOSIS — Z23 Encounter for immunization: Secondary | ICD-10-CM | POA: Diagnosis not present

## 2018-08-27 DIAGNOSIS — Z6841 Body Mass Index (BMI) 40.0 and over, adult: Secondary | ICD-10-CM | POA: Diagnosis not present

## 2018-08-27 DIAGNOSIS — M25511 Pain in right shoulder: Secondary | ICD-10-CM | POA: Diagnosis not present

## 2018-09-04 DIAGNOSIS — E1129 Type 2 diabetes mellitus with other diabetic kidney complication: Secondary | ICD-10-CM | POA: Diagnosis not present

## 2018-09-04 DIAGNOSIS — I10 Essential (primary) hypertension: Secondary | ICD-10-CM | POA: Diagnosis not present

## 2018-09-04 DIAGNOSIS — E782 Mixed hyperlipidemia: Secondary | ICD-10-CM | POA: Diagnosis not present

## 2018-09-04 DIAGNOSIS — R911 Solitary pulmonary nodule: Secondary | ICD-10-CM | POA: Diagnosis not present

## 2018-11-28 DIAGNOSIS — K210 Gastro-esophageal reflux disease with esophagitis: Secondary | ICD-10-CM | POA: Diagnosis not present

## 2018-11-28 DIAGNOSIS — I10 Essential (primary) hypertension: Secondary | ICD-10-CM | POA: Diagnosis not present

## 2018-11-28 DIAGNOSIS — E1165 Type 2 diabetes mellitus with hyperglycemia: Secondary | ICD-10-CM | POA: Diagnosis not present

## 2018-11-28 DIAGNOSIS — E782 Mixed hyperlipidemia: Secondary | ICD-10-CM | POA: Diagnosis not present

## 2018-11-28 DIAGNOSIS — E7800 Pure hypercholesterolemia, unspecified: Secondary | ICD-10-CM | POA: Diagnosis not present

## 2018-12-05 DIAGNOSIS — R911 Solitary pulmonary nodule: Secondary | ICD-10-CM | POA: Diagnosis not present

## 2018-12-05 DIAGNOSIS — E782 Mixed hyperlipidemia: Secondary | ICD-10-CM | POA: Diagnosis not present

## 2018-12-05 DIAGNOSIS — M545 Low back pain: Secondary | ICD-10-CM | POA: Diagnosis not present

## 2018-12-05 DIAGNOSIS — R252 Cramp and spasm: Secondary | ICD-10-CM | POA: Diagnosis not present

## 2018-12-05 DIAGNOSIS — M79601 Pain in right arm: Secondary | ICD-10-CM | POA: Diagnosis not present

## 2018-12-05 DIAGNOSIS — I10 Essential (primary) hypertension: Secondary | ICD-10-CM | POA: Diagnosis not present

## 2019-02-11 ENCOUNTER — Other Ambulatory Visit: Payer: Self-pay

## 2019-02-11 NOTE — Patient Outreach (Signed)
[facility]) Care Management  02/11/2019  Alex Bautista 1956-01-23 [id number]   Medication Adherence call to Mrs. [employer] patient has a disconnected home number and cell number belongs to someone else patient is past due under [place].   [place] Management Direct Dial [phone]  Fax [phone] [name]

## 2019-02-27 DIAGNOSIS — E7800 Pure hypercholesterolemia, unspecified: Secondary | ICD-10-CM | POA: Diagnosis not present

## 2019-02-27 DIAGNOSIS — E1140 Type 2 diabetes mellitus with diabetic neuropathy, unspecified: Secondary | ICD-10-CM | POA: Diagnosis not present

## 2019-02-27 DIAGNOSIS — K210 Gastro-esophageal reflux disease with esophagitis: Secondary | ICD-10-CM | POA: Diagnosis not present

## 2019-02-27 DIAGNOSIS — E1129 Type 2 diabetes mellitus with other diabetic kidney complication: Secondary | ICD-10-CM | POA: Diagnosis not present

## 2019-02-27 DIAGNOSIS — E1165 Type 2 diabetes mellitus with hyperglycemia: Secondary | ICD-10-CM | POA: Diagnosis not present

## 2019-03-04 DIAGNOSIS — I10 Essential (primary) hypertension: Secondary | ICD-10-CM | POA: Diagnosis not present

## 2019-03-04 DIAGNOSIS — R911 Solitary pulmonary nodule: Secondary | ICD-10-CM | POA: Diagnosis not present

## 2019-03-04 DIAGNOSIS — E782 Mixed hyperlipidemia: Secondary | ICD-10-CM | POA: Diagnosis not present

## 2019-03-04 DIAGNOSIS — Z1389 Encounter for screening for other disorder: Secondary | ICD-10-CM | POA: Diagnosis not present

## 2019-06-04 DIAGNOSIS — E1165 Type 2 diabetes mellitus with hyperglycemia: Secondary | ICD-10-CM | POA: Diagnosis not present

## 2019-06-04 DIAGNOSIS — E1140 Type 2 diabetes mellitus with diabetic neuropathy, unspecified: Secondary | ICD-10-CM | POA: Diagnosis not present

## 2019-06-04 DIAGNOSIS — G2581 Restless legs syndrome: Secondary | ICD-10-CM | POA: Diagnosis not present

## 2019-06-04 DIAGNOSIS — E7800 Pure hypercholesterolemia, unspecified: Secondary | ICD-10-CM | POA: Diagnosis not present

## 2019-06-04 DIAGNOSIS — E1129 Type 2 diabetes mellitus with other diabetic kidney complication: Secondary | ICD-10-CM | POA: Diagnosis not present

## 2019-06-10 DIAGNOSIS — Z23 Encounter for immunization: Secondary | ICD-10-CM | POA: Diagnosis not present

## 2019-06-10 DIAGNOSIS — E1129 Type 2 diabetes mellitus with other diabetic kidney complication: Secondary | ICD-10-CM | POA: Diagnosis not present

## 2019-06-10 DIAGNOSIS — R911 Solitary pulmonary nodule: Secondary | ICD-10-CM | POA: Diagnosis not present

## 2019-06-10 DIAGNOSIS — G252 Other specified forms of tremor: Secondary | ICD-10-CM | POA: Diagnosis not present

## 2019-06-10 DIAGNOSIS — E1140 Type 2 diabetes mellitus with diabetic neuropathy, unspecified: Secondary | ICD-10-CM | POA: Diagnosis not present

## 2019-06-10 DIAGNOSIS — I10 Essential (primary) hypertension: Secondary | ICD-10-CM | POA: Diagnosis not present

## 2019-07-29 DIAGNOSIS — I10 Essential (primary) hypertension: Secondary | ICD-10-CM | POA: Diagnosis not present

## 2019-07-29 DIAGNOSIS — E782 Mixed hyperlipidemia: Secondary | ICD-10-CM | POA: Diagnosis not present

## 2019-09-11 DIAGNOSIS — E782 Mixed hyperlipidemia: Secondary | ICD-10-CM | POA: Diagnosis not present

## 2019-09-11 DIAGNOSIS — E1165 Type 2 diabetes mellitus with hyperglycemia: Secondary | ICD-10-CM | POA: Diagnosis not present

## 2019-09-11 DIAGNOSIS — E1140 Type 2 diabetes mellitus with diabetic neuropathy, unspecified: Secondary | ICD-10-CM | POA: Diagnosis not present

## 2019-09-11 DIAGNOSIS — G4733 Obstructive sleep apnea (adult) (pediatric): Secondary | ICD-10-CM | POA: Diagnosis not present

## 2019-09-11 DIAGNOSIS — E7800 Pure hypercholesterolemia, unspecified: Secondary | ICD-10-CM | POA: Diagnosis not present

## 2019-09-11 DIAGNOSIS — E1129 Type 2 diabetes mellitus with other diabetic kidney complication: Secondary | ICD-10-CM | POA: Diagnosis not present

## 2019-09-17 DIAGNOSIS — E1140 Type 2 diabetes mellitus with diabetic neuropathy, unspecified: Secondary | ICD-10-CM | POA: Diagnosis not present

## 2019-09-17 DIAGNOSIS — E1129 Type 2 diabetes mellitus with other diabetic kidney complication: Secondary | ICD-10-CM | POA: Diagnosis not present

## 2019-09-17 DIAGNOSIS — R911 Solitary pulmonary nodule: Secondary | ICD-10-CM | POA: Diagnosis not present

## 2019-09-17 DIAGNOSIS — I10 Essential (primary) hypertension: Secondary | ICD-10-CM | POA: Diagnosis not present

## 2019-09-17 DIAGNOSIS — E1165 Type 2 diabetes mellitus with hyperglycemia: Secondary | ICD-10-CM | POA: Diagnosis not present

## 2019-11-12 ENCOUNTER — Other Ambulatory Visit: Payer: Self-pay

## 2019-11-12 NOTE — Patient Outreach (Signed)
[facility]) Care Management  11/12/2019  Darrik Richman Deans [birth year] [id number]   Medication Adherence call to Mr. Kelin [employer] Voice message left with a call back number. Not sure if this is the patients correct telephone number. Mr. Ontiveros is showing past due on Metformin Er 500 mg under [employer].  Lillia Abed CPhT Pharmacy Technician [facility] Care Management Direct Dial [phone]  Fax [phone] [email or web address]

## 2019-11-18 DIAGNOSIS — R109 Unspecified abdominal pain: Secondary | ICD-10-CM | POA: Diagnosis not present

## 2019-11-18 DIAGNOSIS — M25562 Pain in left knee: Secondary | ICD-10-CM | POA: Diagnosis not present

## 2019-11-21 DIAGNOSIS — R109 Unspecified abdominal pain: Secondary | ICD-10-CM | POA: Diagnosis not present

## 2019-11-26 DIAGNOSIS — M170 Bilateral primary osteoarthritis of knee: Secondary | ICD-10-CM | POA: Diagnosis not present

## 2019-12-04 DIAGNOSIS — E1129 Type 2 diabetes mellitus with other diabetic kidney complication: Secondary | ICD-10-CM | POA: Diagnosis not present

## 2019-12-04 DIAGNOSIS — E782 Mixed hyperlipidemia: Secondary | ICD-10-CM | POA: Diagnosis not present

## 2019-12-04 DIAGNOSIS — E1140 Type 2 diabetes mellitus with diabetic neuropathy, unspecified: Secondary | ICD-10-CM | POA: Diagnosis not present

## 2019-12-04 DIAGNOSIS — E1165 Type 2 diabetes mellitus with hyperglycemia: Secondary | ICD-10-CM | POA: Diagnosis not present

## 2019-12-04 DIAGNOSIS — E7800 Pure hypercholesterolemia, unspecified: Secondary | ICD-10-CM | POA: Diagnosis not present

## 2019-12-07 DIAGNOSIS — R109 Unspecified abdominal pain: Secondary | ICD-10-CM | POA: Diagnosis not present

## 2019-12-07 DIAGNOSIS — R1032 Left lower quadrant pain: Secondary | ICD-10-CM | POA: Diagnosis not present

## 2019-12-07 DIAGNOSIS — R1012 Left upper quadrant pain: Secondary | ICD-10-CM | POA: Diagnosis not present

## 2019-12-11 DIAGNOSIS — I10 Essential (primary) hypertension: Secondary | ICD-10-CM | POA: Diagnosis not present

## 2019-12-11 DIAGNOSIS — G2581 Restless legs syndrome: Secondary | ICD-10-CM | POA: Diagnosis not present

## 2019-12-11 DIAGNOSIS — E782 Mixed hyperlipidemia: Secondary | ICD-10-CM | POA: Diagnosis not present

## 2019-12-11 DIAGNOSIS — R911 Solitary pulmonary nodule: Secondary | ICD-10-CM | POA: Diagnosis not present

## 2019-12-11 DIAGNOSIS — M545 Low back pain: Secondary | ICD-10-CM | POA: Diagnosis not present

## 2019-12-11 DIAGNOSIS — R252 Cramp and spasm: Secondary | ICD-10-CM | POA: Diagnosis not present

## 2019-12-11 DIAGNOSIS — R4582 Worries: Secondary | ICD-10-CM | POA: Diagnosis not present

## 2019-12-13 DIAGNOSIS — R1012 Left upper quadrant pain: Secondary | ICD-10-CM | POA: Diagnosis not present

## 2019-12-13 DIAGNOSIS — R918 Other nonspecific abnormal finding of lung field: Secondary | ICD-10-CM | POA: Diagnosis not present

## 2019-12-13 DIAGNOSIS — R161 Splenomegaly, not elsewhere classified: Secondary | ICD-10-CM | POA: Diagnosis not present

## 2019-12-13 DIAGNOSIS — K760 Fatty (change of) liver, not elsewhere classified: Secondary | ICD-10-CM | POA: Diagnosis not present

## 2019-12-13 DIAGNOSIS — R1032 Left lower quadrant pain: Secondary | ICD-10-CM | POA: Diagnosis not present

## 2020-02-25 DIAGNOSIS — E1140 Type 2 diabetes mellitus with diabetic neuropathy, unspecified: Secondary | ICD-10-CM | POA: Diagnosis not present

## 2020-02-25 DIAGNOSIS — E782 Mixed hyperlipidemia: Secondary | ICD-10-CM | POA: Diagnosis not present

## 2020-02-25 DIAGNOSIS — E1129 Type 2 diabetes mellitus with other diabetic kidney complication: Secondary | ICD-10-CM | POA: Diagnosis not present

## 2020-02-25 DIAGNOSIS — E1165 Type 2 diabetes mellitus with hyperglycemia: Secondary | ICD-10-CM | POA: Diagnosis not present

## 2020-03-04 DIAGNOSIS — R4582 Worries: Secondary | ICD-10-CM | POA: Diagnosis not present

## 2020-03-04 DIAGNOSIS — E1129 Type 2 diabetes mellitus with other diabetic kidney complication: Secondary | ICD-10-CM | POA: Diagnosis not present

## 2020-03-04 DIAGNOSIS — R252 Cramp and spasm: Secondary | ICD-10-CM | POA: Diagnosis not present

## 2020-03-04 DIAGNOSIS — R911 Solitary pulmonary nodule: Secondary | ICD-10-CM | POA: Diagnosis not present

## 2020-03-04 DIAGNOSIS — I10 Essential (primary) hypertension: Secondary | ICD-10-CM | POA: Diagnosis not present

## 2020-03-04 DIAGNOSIS — E1140 Type 2 diabetes mellitus with diabetic neuropathy, unspecified: Secondary | ICD-10-CM | POA: Diagnosis not present

## 2020-04-22 ENCOUNTER — Encounter (INDEPENDENT_AMBULATORY_CARE_PROVIDER_SITE_OTHER): Payer: Self-pay | Admitting: Gastroenterology

## 2020-05-11 ENCOUNTER — Ambulatory Visit (INDEPENDENT_AMBULATORY_CARE_PROVIDER_SITE_OTHER): Payer: PRIVATE HEALTH INSURANCE | Admitting: Gastroenterology

## 2020-05-25 DIAGNOSIS — E1165 Type 2 diabetes mellitus with hyperglycemia: Secondary | ICD-10-CM | POA: Diagnosis not present

## 2020-05-25 DIAGNOSIS — K2100 Gastro-esophageal reflux disease with esophagitis, without bleeding: Secondary | ICD-10-CM | POA: Diagnosis not present

## 2020-05-25 DIAGNOSIS — E1129 Type 2 diabetes mellitus with other diabetic kidney complication: Secondary | ICD-10-CM | POA: Diagnosis not present

## 2020-05-25 DIAGNOSIS — E7800 Pure hypercholesterolemia, unspecified: Secondary | ICD-10-CM | POA: Diagnosis not present

## 2020-05-25 DIAGNOSIS — I10 Essential (primary) hypertension: Secondary | ICD-10-CM | POA: Diagnosis not present

## 2020-06-01 DIAGNOSIS — I10 Essential (primary) hypertension: Secondary | ICD-10-CM | POA: Diagnosis not present

## 2020-06-01 DIAGNOSIS — R4582 Worries: Secondary | ICD-10-CM | POA: Diagnosis not present

## 2020-06-01 DIAGNOSIS — R911 Solitary pulmonary nodule: Secondary | ICD-10-CM | POA: Diagnosis not present

## 2020-06-01 DIAGNOSIS — E1140 Type 2 diabetes mellitus with diabetic neuropathy, unspecified: Secondary | ICD-10-CM | POA: Diagnosis not present

## 2020-06-01 DIAGNOSIS — E1165 Type 2 diabetes mellitus with hyperglycemia: Secondary | ICD-10-CM | POA: Diagnosis not present

## 2020-06-01 DIAGNOSIS — E1129 Type 2 diabetes mellitus with other diabetic kidney complication: Secondary | ICD-10-CM | POA: Diagnosis not present

## 2020-06-01 DIAGNOSIS — Z23 Encounter for immunization: Secondary | ICD-10-CM | POA: Diagnosis not present

## 2020-08-26 DIAGNOSIS — E1140 Type 2 diabetes mellitus with diabetic neuropathy, unspecified: Secondary | ICD-10-CM | POA: Diagnosis not present

## 2020-08-26 DIAGNOSIS — E1165 Type 2 diabetes mellitus with hyperglycemia: Secondary | ICD-10-CM | POA: Diagnosis not present

## 2020-08-26 DIAGNOSIS — E1129 Type 2 diabetes mellitus with other diabetic kidney complication: Secondary | ICD-10-CM | POA: Diagnosis not present

## 2020-08-26 DIAGNOSIS — E782 Mixed hyperlipidemia: Secondary | ICD-10-CM | POA: Diagnosis not present

## 2020-08-31 DIAGNOSIS — I10 Essential (primary) hypertension: Secondary | ICD-10-CM | POA: Diagnosis not present

## 2020-08-31 DIAGNOSIS — E1165 Type 2 diabetes mellitus with hyperglycemia: Secondary | ICD-10-CM | POA: Diagnosis not present

## 2020-08-31 DIAGNOSIS — R4582 Worries: Secondary | ICD-10-CM | POA: Diagnosis not present

## 2020-08-31 DIAGNOSIS — R911 Solitary pulmonary nodule: Secondary | ICD-10-CM | POA: Diagnosis not present

## 2020-08-31 DIAGNOSIS — Z0001 Encounter for general adult medical examination with abnormal findings: Secondary | ICD-10-CM | POA: Diagnosis not present

## 2020-08-31 DIAGNOSIS — E1129 Type 2 diabetes mellitus with other diabetic kidney complication: Secondary | ICD-10-CM | POA: Diagnosis not present

## 2020-08-31 DIAGNOSIS — E782 Mixed hyperlipidemia: Secondary | ICD-10-CM | POA: Diagnosis not present

## 2020-09-28 DIAGNOSIS — S39011A Strain of muscle, fascia and tendon of abdomen, initial encounter: Secondary | ICD-10-CM | POA: Diagnosis not present

## 2020-11-11 DIAGNOSIS — E1165 Type 2 diabetes mellitus with hyperglycemia: Secondary | ICD-10-CM | POA: Diagnosis not present

## 2020-11-11 DIAGNOSIS — R42 Dizziness and giddiness: Secondary | ICD-10-CM | POA: Diagnosis not present

## 2020-11-25 DIAGNOSIS — E782 Mixed hyperlipidemia: Secondary | ICD-10-CM | POA: Diagnosis not present

## 2020-11-25 DIAGNOSIS — E7849 Other hyperlipidemia: Secondary | ICD-10-CM | POA: Diagnosis not present

## 2020-11-25 DIAGNOSIS — K2100 Gastro-esophageal reflux disease with esophagitis, without bleeding: Secondary | ICD-10-CM | POA: Diagnosis not present

## 2020-11-25 DIAGNOSIS — E1129 Type 2 diabetes mellitus with other diabetic kidney complication: Secondary | ICD-10-CM | POA: Diagnosis not present

## 2020-11-25 DIAGNOSIS — Z1159 Encounter for screening for other viral diseases: Secondary | ICD-10-CM | POA: Diagnosis not present

## 2020-11-25 DIAGNOSIS — E7800 Pure hypercholesterolemia, unspecified: Secondary | ICD-10-CM | POA: Diagnosis not present

## 2020-11-25 DIAGNOSIS — I10 Essential (primary) hypertension: Secondary | ICD-10-CM | POA: Diagnosis not present

## 2020-11-30 DIAGNOSIS — R252 Cramp and spasm: Secondary | ICD-10-CM | POA: Diagnosis not present

## 2020-11-30 DIAGNOSIS — G4733 Obstructive sleep apnea (adult) (pediatric): Secondary | ICD-10-CM | POA: Diagnosis not present

## 2020-11-30 DIAGNOSIS — E7849 Other hyperlipidemia: Secondary | ICD-10-CM | POA: Diagnosis not present

## 2020-11-30 DIAGNOSIS — R4582 Worries: Secondary | ICD-10-CM | POA: Diagnosis not present

## 2020-11-30 DIAGNOSIS — Z79899 Other long term (current) drug therapy: Secondary | ICD-10-CM | POA: Diagnosis not present

## 2020-11-30 DIAGNOSIS — E1129 Type 2 diabetes mellitus with other diabetic kidney complication: Secondary | ICD-10-CM | POA: Diagnosis not present

## 2020-11-30 DIAGNOSIS — R911 Solitary pulmonary nodule: Secondary | ICD-10-CM | POA: Diagnosis not present

## 2020-11-30 DIAGNOSIS — E1140 Type 2 diabetes mellitus with diabetic neuropathy, unspecified: Secondary | ICD-10-CM | POA: Diagnosis not present

## 2020-11-30 DIAGNOSIS — E782 Mixed hyperlipidemia: Secondary | ICD-10-CM | POA: Diagnosis not present

## 2020-11-30 DIAGNOSIS — M545 Low back pain, unspecified: Secondary | ICD-10-CM | POA: Diagnosis not present

## 2020-11-30 DIAGNOSIS — I10 Essential (primary) hypertension: Secondary | ICD-10-CM | POA: Diagnosis not present

## 2021-02-17 DIAGNOSIS — K2100 Gastro-esophageal reflux disease with esophagitis, without bleeding: Secondary | ICD-10-CM | POA: Diagnosis not present

## 2021-02-17 DIAGNOSIS — E7849 Other hyperlipidemia: Secondary | ICD-10-CM | POA: Diagnosis not present

## 2021-02-17 DIAGNOSIS — E782 Mixed hyperlipidemia: Secondary | ICD-10-CM | POA: Diagnosis not present

## 2021-02-17 DIAGNOSIS — E1140 Type 2 diabetes mellitus with diabetic neuropathy, unspecified: Secondary | ICD-10-CM | POA: Diagnosis not present

## 2021-02-17 DIAGNOSIS — I10 Essential (primary) hypertension: Secondary | ICD-10-CM | POA: Diagnosis not present

## 2021-02-23 DIAGNOSIS — E1129 Type 2 diabetes mellitus with other diabetic kidney complication: Secondary | ICD-10-CM | POA: Diagnosis not present

## 2021-02-23 DIAGNOSIS — E1140 Type 2 diabetes mellitus with diabetic neuropathy, unspecified: Secondary | ICD-10-CM | POA: Diagnosis not present

## 2021-02-23 DIAGNOSIS — R4582 Worries: Secondary | ICD-10-CM | POA: Diagnosis not present

## 2021-02-23 DIAGNOSIS — I10 Essential (primary) hypertension: Secondary | ICD-10-CM | POA: Diagnosis not present

## 2021-02-23 DIAGNOSIS — E7849 Other hyperlipidemia: Secondary | ICD-10-CM | POA: Diagnosis not present

## 2021-02-23 DIAGNOSIS — R911 Solitary pulmonary nodule: Secondary | ICD-10-CM | POA: Diagnosis not present

## 2021-02-23 DIAGNOSIS — E1165 Type 2 diabetes mellitus with hyperglycemia: Secondary | ICD-10-CM | POA: Diagnosis not present

## 2021-03-20 DIAGNOSIS — N481 Balanitis: Secondary | ICD-10-CM | POA: Diagnosis not present

## 2021-03-20 DIAGNOSIS — L237 Allergic contact dermatitis due to plants, except food: Secondary | ICD-10-CM | POA: Diagnosis not present

## 2021-05-26 DIAGNOSIS — K2100 Gastro-esophageal reflux disease with esophagitis, without bleeding: Secondary | ICD-10-CM | POA: Diagnosis not present

## 2021-05-26 DIAGNOSIS — E7849 Other hyperlipidemia: Secondary | ICD-10-CM | POA: Diagnosis not present

## 2021-05-26 DIAGNOSIS — E782 Mixed hyperlipidemia: Secondary | ICD-10-CM | POA: Diagnosis not present

## 2021-05-26 DIAGNOSIS — E1129 Type 2 diabetes mellitus with other diabetic kidney complication: Secondary | ICD-10-CM | POA: Diagnosis not present

## 2021-05-26 DIAGNOSIS — I10 Essential (primary) hypertension: Secondary | ICD-10-CM | POA: Diagnosis not present

## 2021-05-31 DIAGNOSIS — E1165 Type 2 diabetes mellitus with hyperglycemia: Secondary | ICD-10-CM | POA: Diagnosis not present

## 2021-05-31 DIAGNOSIS — E1140 Type 2 diabetes mellitus with diabetic neuropathy, unspecified: Secondary | ICD-10-CM | POA: Diagnosis not present

## 2021-05-31 DIAGNOSIS — R4582 Worries: Secondary | ICD-10-CM | POA: Diagnosis not present

## 2021-05-31 DIAGNOSIS — Z23 Encounter for immunization: Secondary | ICD-10-CM | POA: Diagnosis not present

## 2021-05-31 DIAGNOSIS — E7849 Other hyperlipidemia: Secondary | ICD-10-CM | POA: Diagnosis not present

## 2021-05-31 DIAGNOSIS — I10 Essential (primary) hypertension: Secondary | ICD-10-CM | POA: Diagnosis not present

## 2021-05-31 DIAGNOSIS — R911 Solitary pulmonary nodule: Secondary | ICD-10-CM | POA: Diagnosis not present

## 2021-06-09 DIAGNOSIS — Z23 Encounter for immunization: Secondary | ICD-10-CM | POA: Diagnosis not present

## 2021-07-06 DIAGNOSIS — J069 Acute upper respiratory infection, unspecified: Secondary | ICD-10-CM | POA: Diagnosis not present

## 2021-07-06 DIAGNOSIS — A084 Viral intestinal infection, unspecified: Secondary | ICD-10-CM | POA: Diagnosis not present

## 2021-07-07 DIAGNOSIS — E119 Type 2 diabetes mellitus without complications: Secondary | ICD-10-CM | POA: Diagnosis not present

## 2021-07-07 DIAGNOSIS — Z20822 Contact with and (suspected) exposure to covid-19: Secondary | ICD-10-CM | POA: Diagnosis not present

## 2021-07-07 DIAGNOSIS — R059 Cough, unspecified: Secondary | ICD-10-CM | POA: Diagnosis not present

## 2021-07-07 DIAGNOSIS — M1990 Unspecified osteoarthritis, unspecified site: Secondary | ICD-10-CM | POA: Diagnosis not present

## 2021-07-07 DIAGNOSIS — R9431 Abnormal electrocardiogram [ECG] [EKG]: Secondary | ICD-10-CM | POA: Diagnosis not present

## 2021-07-07 DIAGNOSIS — R0789 Other chest pain: Secondary | ICD-10-CM | POA: Diagnosis not present

## 2021-07-07 DIAGNOSIS — R0602 Shortness of breath: Secondary | ICD-10-CM | POA: Diagnosis not present

## 2021-07-07 DIAGNOSIS — R079 Chest pain, unspecified: Secondary | ICD-10-CM | POA: Diagnosis not present

## 2021-08-20 DIAGNOSIS — S2231XA Fracture of one rib, right side, initial encounter for closed fracture: Secondary | ICD-10-CM | POA: Diagnosis not present

## 2021-08-25 DIAGNOSIS — K2100 Gastro-esophageal reflux disease with esophagitis, without bleeding: Secondary | ICD-10-CM | POA: Diagnosis not present

## 2021-08-25 DIAGNOSIS — E7800 Pure hypercholesterolemia, unspecified: Secondary | ICD-10-CM | POA: Diagnosis not present

## 2021-08-25 DIAGNOSIS — E1140 Type 2 diabetes mellitus with diabetic neuropathy, unspecified: Secondary | ICD-10-CM | POA: Diagnosis not present

## 2021-08-25 DIAGNOSIS — E1129 Type 2 diabetes mellitus with other diabetic kidney complication: Secondary | ICD-10-CM | POA: Diagnosis not present

## 2021-08-25 DIAGNOSIS — E1165 Type 2 diabetes mellitus with hyperglycemia: Secondary | ICD-10-CM | POA: Diagnosis not present

## 2021-08-30 DIAGNOSIS — Z23 Encounter for immunization: Secondary | ICD-10-CM | POA: Diagnosis not present

## 2021-08-30 DIAGNOSIS — I10 Essential (primary) hypertension: Secondary | ICD-10-CM | POA: Diagnosis not present

## 2021-08-30 DIAGNOSIS — E1129 Type 2 diabetes mellitus with other diabetic kidney complication: Secondary | ICD-10-CM | POA: Diagnosis not present

## 2021-08-30 DIAGNOSIS — E7849 Other hyperlipidemia: Secondary | ICD-10-CM | POA: Diagnosis not present

## 2021-08-30 DIAGNOSIS — R911 Solitary pulmonary nodule: Secondary | ICD-10-CM | POA: Diagnosis not present

## 2021-08-30 DIAGNOSIS — R4582 Worries: Secondary | ICD-10-CM | POA: Diagnosis not present

## 2021-08-30 DIAGNOSIS — E1165 Type 2 diabetes mellitus with hyperglycemia: Secondary | ICD-10-CM | POA: Diagnosis not present

## 2021-09-09 DIAGNOSIS — R059 Cough, unspecified: Secondary | ICD-10-CM | POA: Diagnosis not present

## 2021-11-24 DIAGNOSIS — E1165 Type 2 diabetes mellitus with hyperglycemia: Secondary | ICD-10-CM | POA: Diagnosis not present

## 2021-11-24 DIAGNOSIS — E782 Mixed hyperlipidemia: Secondary | ICD-10-CM | POA: Diagnosis not present

## 2021-11-24 DIAGNOSIS — E7849 Other hyperlipidemia: Secondary | ICD-10-CM | POA: Diagnosis not present

## 2021-11-24 DIAGNOSIS — I10 Essential (primary) hypertension: Secondary | ICD-10-CM | POA: Diagnosis not present

## 2021-11-24 DIAGNOSIS — K2100 Gastro-esophageal reflux disease with esophagitis, without bleeding: Secondary | ICD-10-CM | POA: Diagnosis not present

## 2021-11-29 DIAGNOSIS — I10 Essential (primary) hypertension: Secondary | ICD-10-CM | POA: Diagnosis not present

## 2021-11-29 DIAGNOSIS — Z0001 Encounter for general adult medical examination with abnormal findings: Secondary | ICD-10-CM | POA: Diagnosis not present

## 2021-11-29 DIAGNOSIS — E1140 Type 2 diabetes mellitus with diabetic neuropathy, unspecified: Secondary | ICD-10-CM | POA: Diagnosis not present

## 2021-11-29 DIAGNOSIS — R4582 Worries: Secondary | ICD-10-CM | POA: Diagnosis not present

## 2021-11-29 DIAGNOSIS — N138 Other obstructive and reflux uropathy: Secondary | ICD-10-CM | POA: Diagnosis not present

## 2021-11-29 DIAGNOSIS — E1129 Type 2 diabetes mellitus with other diabetic kidney complication: Secondary | ICD-10-CM | POA: Diagnosis not present

## 2021-11-29 DIAGNOSIS — E1165 Type 2 diabetes mellitus with hyperglycemia: Secondary | ICD-10-CM | POA: Diagnosis not present

## 2022-01-14 DIAGNOSIS — R1011 Right upper quadrant pain: Secondary | ICD-10-CM | POA: Diagnosis not present

## 2022-01-14 DIAGNOSIS — S50869A Insect bite (nonvenomous) of unspecified forearm, initial encounter: Secondary | ICD-10-CM | POA: Diagnosis not present

## 2022-01-14 DIAGNOSIS — I10 Essential (primary) hypertension: Secondary | ICD-10-CM | POA: Diagnosis not present

## 2022-01-14 DIAGNOSIS — R059 Cough, unspecified: Secondary | ICD-10-CM | POA: Diagnosis not present

## 2022-02-08 DIAGNOSIS — E782 Mixed hyperlipidemia: Secondary | ICD-10-CM | POA: Diagnosis not present

## 2022-02-08 DIAGNOSIS — E1129 Type 2 diabetes mellitus with other diabetic kidney complication: Secondary | ICD-10-CM | POA: Diagnosis not present

## 2022-02-08 DIAGNOSIS — E1165 Type 2 diabetes mellitus with hyperglycemia: Secondary | ICD-10-CM | POA: Diagnosis not present

## 2022-02-08 DIAGNOSIS — I10 Essential (primary) hypertension: Secondary | ICD-10-CM | POA: Diagnosis not present

## 2022-02-08 DIAGNOSIS — Z0001 Encounter for general adult medical examination with abnormal findings: Secondary | ICD-10-CM | POA: Diagnosis not present

## 2022-02-08 DIAGNOSIS — E7849 Other hyperlipidemia: Secondary | ICD-10-CM | POA: Diagnosis not present

## 2022-02-08 DIAGNOSIS — E7800 Pure hypercholesterolemia, unspecified: Secondary | ICD-10-CM | POA: Diagnosis not present

## 2022-02-16 DIAGNOSIS — R252 Cramp and spasm: Secondary | ICD-10-CM | POA: Diagnosis not present

## 2022-02-16 DIAGNOSIS — E7849 Other hyperlipidemia: Secondary | ICD-10-CM | POA: Diagnosis not present

## 2022-02-16 DIAGNOSIS — R911 Solitary pulmonary nodule: Secondary | ICD-10-CM | POA: Diagnosis not present

## 2022-02-16 DIAGNOSIS — I10 Essential (primary) hypertension: Secondary | ICD-10-CM | POA: Diagnosis not present

## 2022-02-16 DIAGNOSIS — R4582 Worries: Secondary | ICD-10-CM | POA: Diagnosis not present

## 2022-02-16 DIAGNOSIS — E1129 Type 2 diabetes mellitus with other diabetic kidney complication: Secondary | ICD-10-CM | POA: Diagnosis not present

## 2022-02-16 DIAGNOSIS — R809 Proteinuria, unspecified: Secondary | ICD-10-CM | POA: Diagnosis not present

## 2022-02-16 DIAGNOSIS — E1140 Type 2 diabetes mellitus with diabetic neuropathy, unspecified: Secondary | ICD-10-CM | POA: Diagnosis not present

## 2022-02-16 DIAGNOSIS — E1165 Type 2 diabetes mellitus with hyperglycemia: Secondary | ICD-10-CM | POA: Diagnosis not present

## 2022-02-16 DIAGNOSIS — N138 Other obstructive and reflux uropathy: Secondary | ICD-10-CM | POA: Diagnosis not present

## 2022-02-16 DIAGNOSIS — M79601 Pain in right arm: Secondary | ICD-10-CM | POA: Diagnosis not present

## 2022-03-25 DIAGNOSIS — R1031 Right lower quadrant pain: Secondary | ICD-10-CM | POA: Diagnosis not present

## 2022-03-27 DIAGNOSIS — I10 Essential (primary) hypertension: Secondary | ICD-10-CM | POA: Diagnosis not present

## 2022-03-27 DIAGNOSIS — M4726 Other spondylosis with radiculopathy, lumbar region: Secondary | ICD-10-CM | POA: Diagnosis not present

## 2022-03-27 DIAGNOSIS — E119 Type 2 diabetes mellitus without complications: Secondary | ICD-10-CM | POA: Diagnosis not present

## 2022-03-27 DIAGNOSIS — X501XXA Overexertion from prolonged static or awkward postures, initial encounter: Secondary | ICD-10-CM | POA: Diagnosis not present

## 2022-03-27 DIAGNOSIS — S39012A Strain of muscle, fascia and tendon of lower back, initial encounter: Secondary | ICD-10-CM | POA: Diagnosis not present

## 2022-03-27 DIAGNOSIS — M545 Low back pain, unspecified: Secondary | ICD-10-CM | POA: Diagnosis not present

## 2022-03-27 DIAGNOSIS — M47816 Spondylosis without myelopathy or radiculopathy, lumbar region: Secondary | ICD-10-CM | POA: Diagnosis not present

## 2022-04-25 DIAGNOSIS — E7849 Other hyperlipidemia: Secondary | ICD-10-CM | POA: Diagnosis not present

## 2022-04-25 DIAGNOSIS — E1129 Type 2 diabetes mellitus with other diabetic kidney complication: Secondary | ICD-10-CM | POA: Diagnosis not present

## 2022-05-03 DIAGNOSIS — Z23 Encounter for immunization: Secondary | ICD-10-CM | POA: Diagnosis not present

## 2022-05-03 DIAGNOSIS — N138 Other obstructive and reflux uropathy: Secondary | ICD-10-CM | POA: Diagnosis not present

## 2022-05-03 DIAGNOSIS — G2581 Restless legs syndrome: Secondary | ICD-10-CM | POA: Diagnosis not present

## 2022-05-03 DIAGNOSIS — G4733 Obstructive sleep apnea (adult) (pediatric): Secondary | ICD-10-CM | POA: Diagnosis not present

## 2022-05-03 DIAGNOSIS — R911 Solitary pulmonary nodule: Secondary | ICD-10-CM | POA: Diagnosis not present

## 2022-05-03 DIAGNOSIS — E7849 Other hyperlipidemia: Secondary | ICD-10-CM | POA: Diagnosis not present

## 2022-05-03 DIAGNOSIS — R252 Cramp and spasm: Secondary | ICD-10-CM | POA: Diagnosis not present

## 2022-05-03 DIAGNOSIS — I10 Essential (primary) hypertension: Secondary | ICD-10-CM | POA: Diagnosis not present

## 2022-05-03 DIAGNOSIS — E1140 Type 2 diabetes mellitus with diabetic neuropathy, unspecified: Secondary | ICD-10-CM | POA: Diagnosis not present

## 2022-05-25 DIAGNOSIS — R1031 Right lower quadrant pain: Secondary | ICD-10-CM | POA: Diagnosis not present

## 2022-08-29 DIAGNOSIS — Z20822 Contact with and (suspected) exposure to covid-19: Secondary | ICD-10-CM | POA: Diagnosis not present

## 2022-08-29 DIAGNOSIS — R059 Cough, unspecified: Secondary | ICD-10-CM | POA: Diagnosis not present

## 2022-09-02 DIAGNOSIS — I10 Essential (primary) hypertension: Secondary | ICD-10-CM | POA: Diagnosis not present

## 2022-09-02 DIAGNOSIS — E7849 Other hyperlipidemia: Secondary | ICD-10-CM | POA: Diagnosis not present

## 2022-09-02 DIAGNOSIS — E1165 Type 2 diabetes mellitus with hyperglycemia: Secondary | ICD-10-CM | POA: Diagnosis not present

## 2022-09-02 DIAGNOSIS — E1140 Type 2 diabetes mellitus with diabetic neuropathy, unspecified: Secondary | ICD-10-CM | POA: Diagnosis not present

## 2022-09-02 DIAGNOSIS — E1129 Type 2 diabetes mellitus with other diabetic kidney complication: Secondary | ICD-10-CM | POA: Diagnosis not present

## 2022-09-07 DIAGNOSIS — R4582 Worries: Secondary | ICD-10-CM | POA: Diagnosis not present

## 2022-09-07 DIAGNOSIS — I10 Essential (primary) hypertension: Secondary | ICD-10-CM | POA: Diagnosis not present

## 2022-09-07 DIAGNOSIS — Z23 Encounter for immunization: Secondary | ICD-10-CM | POA: Diagnosis not present

## 2022-09-07 DIAGNOSIS — E1129 Type 2 diabetes mellitus with other diabetic kidney complication: Secondary | ICD-10-CM | POA: Diagnosis not present

## 2022-09-07 DIAGNOSIS — R809 Proteinuria, unspecified: Secondary | ICD-10-CM | POA: Diagnosis not present

## 2022-09-07 DIAGNOSIS — R911 Solitary pulmonary nodule: Secondary | ICD-10-CM | POA: Diagnosis not present

## 2022-09-07 DIAGNOSIS — E7849 Other hyperlipidemia: Secondary | ICD-10-CM | POA: Diagnosis not present

## 2022-09-07 DIAGNOSIS — G629 Polyneuropathy, unspecified: Secondary | ICD-10-CM | POA: Diagnosis not present

## 2022-09-07 DIAGNOSIS — E1165 Type 2 diabetes mellitus with hyperglycemia: Secondary | ICD-10-CM | POA: Diagnosis not present

## 2022-09-07 DIAGNOSIS — E1140 Type 2 diabetes mellitus with diabetic neuropathy, unspecified: Secondary | ICD-10-CM | POA: Diagnosis not present

## 2022-12-15 DIAGNOSIS — E1140 Type 2 diabetes mellitus with diabetic neuropathy, unspecified: Secondary | ICD-10-CM | POA: Diagnosis not present

## 2022-12-15 DIAGNOSIS — I10 Essential (primary) hypertension: Secondary | ICD-10-CM | POA: Diagnosis not present

## 2022-12-15 DIAGNOSIS — K2100 Gastro-esophageal reflux disease with esophagitis, without bleeding: Secondary | ICD-10-CM | POA: Diagnosis not present

## 2022-12-15 DIAGNOSIS — E7800 Pure hypercholesterolemia, unspecified: Secondary | ICD-10-CM | POA: Diagnosis not present

## 2022-12-15 DIAGNOSIS — E7849 Other hyperlipidemia: Secondary | ICD-10-CM | POA: Diagnosis not present

## 2022-12-15 DIAGNOSIS — E1129 Type 2 diabetes mellitus with other diabetic kidney complication: Secondary | ICD-10-CM | POA: Diagnosis not present

## 2022-12-15 DIAGNOSIS — E782 Mixed hyperlipidemia: Secondary | ICD-10-CM | POA: Diagnosis not present

## 2022-12-22 DIAGNOSIS — E7849 Other hyperlipidemia: Secondary | ICD-10-CM | POA: Diagnosis not present

## 2022-12-22 DIAGNOSIS — E1129 Type 2 diabetes mellitus with other diabetic kidney complication: Secondary | ICD-10-CM | POA: Diagnosis not present

## 2022-12-22 DIAGNOSIS — I10 Essential (primary) hypertension: Secondary | ICD-10-CM | POA: Diagnosis not present

## 2022-12-22 DIAGNOSIS — E1165 Type 2 diabetes mellitus with hyperglycemia: Secondary | ICD-10-CM | POA: Diagnosis not present

## 2022-12-22 DIAGNOSIS — E1140 Type 2 diabetes mellitus with diabetic neuropathy, unspecified: Secondary | ICD-10-CM | POA: Diagnosis not present

## 2022-12-22 DIAGNOSIS — N138 Other obstructive and reflux uropathy: Secondary | ICD-10-CM | POA: Diagnosis not present

## 2022-12-22 DIAGNOSIS — R911 Solitary pulmonary nodule: Secondary | ICD-10-CM | POA: Diagnosis not present

## 2022-12-22 DIAGNOSIS — G2581 Restless legs syndrome: Secondary | ICD-10-CM | POA: Diagnosis not present

## 2022-12-22 DIAGNOSIS — Z0001 Encounter for general adult medical examination with abnormal findings: Secondary | ICD-10-CM | POA: Diagnosis not present

## 2022-12-22 DIAGNOSIS — Z23 Encounter for immunization: Secondary | ICD-10-CM | POA: Diagnosis not present

## 2023-01-25 DIAGNOSIS — G4730 Sleep apnea, unspecified: Secondary | ICD-10-CM | POA: Diagnosis not present

## 2023-01-25 DIAGNOSIS — K219 Gastro-esophageal reflux disease without esophagitis: Secondary | ICD-10-CM | POA: Diagnosis not present

## 2023-01-25 DIAGNOSIS — G8929 Other chronic pain: Secondary | ICD-10-CM | POA: Diagnosis not present

## 2023-01-25 DIAGNOSIS — E119 Type 2 diabetes mellitus without complications: Secondary | ICD-10-CM | POA: Diagnosis not present

## 2023-01-25 DIAGNOSIS — Z7689 Persons encountering health services in other specified circumstances: Secondary | ICD-10-CM | POA: Diagnosis not present

## 2023-01-25 DIAGNOSIS — Z713 Dietary counseling and surveillance: Secondary | ICD-10-CM | POA: Diagnosis not present

## 2023-01-25 DIAGNOSIS — Z79899 Other long term (current) drug therapy: Secondary | ICD-10-CM | POA: Diagnosis not present

## 2023-02-05 DIAGNOSIS — R0789 Other chest pain: Secondary | ICD-10-CM | POA: Diagnosis not present

## 2023-02-05 DIAGNOSIS — R9431 Abnormal electrocardiogram [ECG] [EKG]: Secondary | ICD-10-CM | POA: Diagnosis not present

## 2023-02-05 DIAGNOSIS — R079 Chest pain, unspecified: Secondary | ICD-10-CM | POA: Diagnosis not present

## 2023-02-05 DIAGNOSIS — J9811 Atelectasis: Secondary | ICD-10-CM | POA: Diagnosis not present

## 2023-02-13 DIAGNOSIS — J020 Streptococcal pharyngitis: Secondary | ICD-10-CM | POA: Diagnosis not present

## 2023-02-27 DIAGNOSIS — Z79899 Other long term (current) drug therapy: Secondary | ICD-10-CM | POA: Diagnosis not present

## 2023-02-27 DIAGNOSIS — Z79891 Long term (current) use of opiate analgesic: Secondary | ICD-10-CM | POA: Diagnosis not present

## 2023-02-27 DIAGNOSIS — G894 Chronic pain syndrome: Secondary | ICD-10-CM | POA: Diagnosis not present

## 2023-03-06 DIAGNOSIS — E1129 Type 2 diabetes mellitus with other diabetic kidney complication: Secondary | ICD-10-CM | POA: Diagnosis not present

## 2023-03-06 DIAGNOSIS — E7849 Other hyperlipidemia: Secondary | ICD-10-CM | POA: Diagnosis not present

## 2023-03-06 DIAGNOSIS — K2100 Gastro-esophageal reflux disease with esophagitis, without bleeding: Secondary | ICD-10-CM | POA: Diagnosis not present

## 2023-03-08 DIAGNOSIS — E1129 Type 2 diabetes mellitus with other diabetic kidney complication: Secondary | ICD-10-CM | POA: Diagnosis not present

## 2023-03-08 DIAGNOSIS — K2100 Gastro-esophageal reflux disease with esophagitis, without bleeding: Secondary | ICD-10-CM | POA: Diagnosis not present

## 2023-03-08 DIAGNOSIS — E7849 Other hyperlipidemia: Secondary | ICD-10-CM | POA: Diagnosis not present

## 2023-03-14 DIAGNOSIS — E1140 Type 2 diabetes mellitus with diabetic neuropathy, unspecified: Secondary | ICD-10-CM | POA: Diagnosis not present

## 2023-03-14 DIAGNOSIS — K2100 Gastro-esophageal reflux disease with esophagitis, without bleeding: Secondary | ICD-10-CM | POA: Diagnosis not present

## 2023-03-14 DIAGNOSIS — E1165 Type 2 diabetes mellitus with hyperglycemia: Secondary | ICD-10-CM | POA: Diagnosis not present

## 2023-03-14 DIAGNOSIS — R4582 Worries: Secondary | ICD-10-CM | POA: Diagnosis not present

## 2023-03-14 DIAGNOSIS — R252 Cramp and spasm: Secondary | ICD-10-CM | POA: Diagnosis not present

## 2023-03-14 DIAGNOSIS — R911 Solitary pulmonary nodule: Secondary | ICD-10-CM | POA: Diagnosis not present

## 2023-03-14 DIAGNOSIS — G2581 Restless legs syndrome: Secondary | ICD-10-CM | POA: Diagnosis not present

## 2023-03-14 DIAGNOSIS — E1129 Type 2 diabetes mellitus with other diabetic kidney complication: Secondary | ICD-10-CM | POA: Diagnosis not present

## 2023-03-14 DIAGNOSIS — I10 Essential (primary) hypertension: Secondary | ICD-10-CM | POA: Diagnosis not present

## 2023-03-14 DIAGNOSIS — E7849 Other hyperlipidemia: Secondary | ICD-10-CM | POA: Diagnosis not present

## 2023-06-22 DIAGNOSIS — R1011 Right upper quadrant pain: Secondary | ICD-10-CM | POA: Diagnosis not present

## 2023-06-22 DIAGNOSIS — R0781 Pleurodynia: Secondary | ICD-10-CM | POA: Diagnosis not present

## 2023-07-05 DIAGNOSIS — Z1211 Encounter for screening for malignant neoplasm of colon: Secondary | ICD-10-CM | POA: Diagnosis not present

## 2023-07-14 DIAGNOSIS — E782 Mixed hyperlipidemia: Secondary | ICD-10-CM | POA: Diagnosis not present

## 2023-07-14 DIAGNOSIS — E7849 Other hyperlipidemia: Secondary | ICD-10-CM | POA: Diagnosis not present

## 2023-07-14 DIAGNOSIS — E1140 Type 2 diabetes mellitus with diabetic neuropathy, unspecified: Secondary | ICD-10-CM | POA: Diagnosis not present

## 2023-07-14 DIAGNOSIS — K2100 Gastro-esophageal reflux disease with esophagitis, without bleeding: Secondary | ICD-10-CM | POA: Diagnosis not present

## 2023-07-14 DIAGNOSIS — E119 Type 2 diabetes mellitus without complications: Secondary | ICD-10-CM | POA: Diagnosis not present

## 2023-07-18 DIAGNOSIS — E1129 Type 2 diabetes mellitus with other diabetic kidney complication: Secondary | ICD-10-CM | POA: Diagnosis not present

## 2023-07-18 DIAGNOSIS — Z0389 Encounter for observation for other suspected diseases and conditions ruled out: Secondary | ICD-10-CM | POA: Diagnosis not present

## 2023-07-18 DIAGNOSIS — M545 Low back pain, unspecified: Secondary | ICD-10-CM | POA: Diagnosis not present

## 2023-07-18 DIAGNOSIS — R4582 Worries: Secondary | ICD-10-CM | POA: Diagnosis not present

## 2023-07-18 DIAGNOSIS — I10 Essential (primary) hypertension: Secondary | ICD-10-CM | POA: Diagnosis not present

## 2023-07-18 DIAGNOSIS — G2581 Restless legs syndrome: Secondary | ICD-10-CM | POA: Diagnosis not present

## 2023-07-18 DIAGNOSIS — E7849 Other hyperlipidemia: Secondary | ICD-10-CM | POA: Diagnosis not present

## 2023-07-18 DIAGNOSIS — R911 Solitary pulmonary nodule: Secondary | ICD-10-CM | POA: Diagnosis not present

## 2023-07-18 DIAGNOSIS — N138 Other obstructive and reflux uropathy: Secondary | ICD-10-CM | POA: Diagnosis not present

## 2023-07-18 DIAGNOSIS — R252 Cramp and spasm: Secondary | ICD-10-CM | POA: Diagnosis not present

## 2023-07-18 DIAGNOSIS — E782 Mixed hyperlipidemia: Secondary | ICD-10-CM | POA: Diagnosis not present

## 2023-07-18 DIAGNOSIS — G4733 Obstructive sleep apnea (adult) (pediatric): Secondary | ICD-10-CM | POA: Diagnosis not present

## 2023-07-18 DIAGNOSIS — E1165 Type 2 diabetes mellitus with hyperglycemia: Secondary | ICD-10-CM | POA: Diagnosis not present

## 2023-07-18 DIAGNOSIS — E1140 Type 2 diabetes mellitus with diabetic neuropathy, unspecified: Secondary | ICD-10-CM | POA: Diagnosis not present

## 2023-08-28 ENCOUNTER — Emergency Department (HOSPITAL_COMMUNITY): Payer: Medicare Other

## 2023-08-28 ENCOUNTER — Encounter (HOSPITAL_COMMUNITY): Payer: Self-pay | Admitting: *Deleted

## 2023-08-28 ENCOUNTER — Other Ambulatory Visit: Payer: Self-pay

## 2023-08-28 ENCOUNTER — Emergency Department (HOSPITAL_COMMUNITY)
Admission: EM | Admit: 2023-08-28 | Discharge: 2023-08-28 | Disposition: A | Discharge status: Home / Self Care | Payer: Medicare Other | Attending: Emergency Medicine | Admitting: Emergency Medicine

## 2023-08-28 DIAGNOSIS — R42 Dizziness and giddiness: Secondary | ICD-10-CM | POA: Diagnosis not present

## 2023-08-28 DIAGNOSIS — Z7984 Long term (current) use of oral hypoglycemic drugs: Secondary | ICD-10-CM | POA: Diagnosis not present

## 2023-08-28 DIAGNOSIS — R197 Diarrhea, unspecified: Secondary | ICD-10-CM | POA: Insufficient documentation

## 2023-08-28 DIAGNOSIS — R000 Tachycardia, unspecified: Secondary | ICD-10-CM | POA: Diagnosis not present

## 2023-08-28 DIAGNOSIS — R079 Chest pain, unspecified: Secondary | ICD-10-CM | POA: Diagnosis not present

## 2023-08-28 DIAGNOSIS — R0789 Other chest pain: Secondary | ICD-10-CM | POA: Diagnosis not present

## 2023-08-28 DIAGNOSIS — H9319 Tinnitus, unspecified ear: Secondary | ICD-10-CM | POA: Diagnosis not present

## 2023-08-28 DIAGNOSIS — Z79899 Other long term (current) drug therapy: Secondary | ICD-10-CM | POA: Insufficient documentation

## 2023-08-28 DIAGNOSIS — R1110 Vomiting, unspecified: Secondary | ICD-10-CM | POA: Diagnosis not present

## 2023-08-28 DIAGNOSIS — E119 Type 2 diabetes mellitus without complications: Secondary | ICD-10-CM | POA: Insufficient documentation

## 2023-08-28 DIAGNOSIS — Z7982 Long term (current) use of aspirin: Secondary | ICD-10-CM | POA: Insufficient documentation

## 2023-08-28 DIAGNOSIS — Z20822 Contact with and (suspected) exposure to covid-19: Secondary | ICD-10-CM | POA: Insufficient documentation

## 2023-08-28 LAB — RESP PANEL BY RT-PCR (RSV, FLU A&B, COVID)  RVPGX2
Influenza A by PCR: NEGATIVE
Influenza B by PCR: NEGATIVE
Resp Syncytial Virus by PCR: NEGATIVE
SARS Coronavirus 2 by RT PCR: NEGATIVE

## 2023-08-28 LAB — CBC
HCT: 43.7 % (ref 39.0–52.0)
Hemoglobin: 15.5 g/dL (ref 13.0–17.0)
MCH: 29 pg (ref 26.0–34.0)
MCHC: 35.5 g/dL (ref 30.0–36.0)
MCV: 81.8 fL (ref 80.0–100.0)
Platelets: 175 10*3/uL (ref 150–400)
RBC: 5.34 MIL/uL (ref 4.22–5.81)
RDW: 12.9 % (ref 11.5–15.5)
WBC: 8.4 10*3/uL (ref 4.0–10.5)
nRBC: 0 % (ref 0.0–0.2)

## 2023-08-28 LAB — BASIC METABOLIC PANEL
Anion gap: 12 (ref 5–15)
BUN: 21 mg/dL (ref 8–23)
CO2: 20 mmol/L — ABNORMAL LOW (ref 22–32)
Calcium: 9.3 mg/dL (ref 8.9–10.3)
Chloride: 99 mmol/L (ref 98–111)
Creatinine, Ser: 0.8 mg/dL (ref 0.61–1.24)
GFR, Estimated: >60 mL/min (ref >60)
Glucose, Bld: 308 mg/dL — ABNORMAL HIGH (ref 70–99)
Potassium: 4.2 mmol/L (ref 3.5–5.1)
Sodium: 131 mmol/L — ABNORMAL LOW (ref 135–145)

## 2023-08-28 LAB — TROPONIN I (HIGH SENSITIVITY)
Troponin I (High Sensitivity): 3 ng/L (ref <18)
Troponin I (High Sensitivity): 3 ng/L (ref <18)

## 2023-08-28 LAB — D-DIMER, QUANTITATIVE: D-Dimer, Quant: 0.28 ug{FEU}/mL (ref 0.00–0.50)

## 2023-08-28 MED ORDER — LACTATED RINGERS IV BOLUS
1000.0000 mL | Freq: Once | INTRAVENOUS | Status: AC
  Administered 2023-08-28: 1000 mL via INTRAVENOUS

## 2023-08-28 MED ORDER — MECLIZINE HCL 25 MG PO TABS: 25.0000 mg | ORAL_TABLET | Freq: Three times a day (TID) | ORAL | 0 refills | Status: AC | PRN

## 2023-08-28 MED ORDER — MORPHINE SULFATE (PF) 4 MG/ML IV SOLN
4.0000 mg | Freq: Once | INTRAVENOUS | Status: AC
  Administered 2023-08-28: 4 mg via INTRAVENOUS
  Filled 2023-08-28: qty 1

## 2023-08-28 MED ORDER — MECLIZINE HCL 12.5 MG PO TABS
25.0000 mg | ORAL_TABLET | Freq: Once | ORAL | Status: AC
  Administered 2023-08-28: 25 mg via ORAL
  Filled 2023-08-28: qty 2

## 2023-08-28 MED ORDER — ONDANSETRON HCL 4 MG/2ML IJ SOLN
4.0000 mg | Freq: Once | INTRAMUSCULAR | Status: AC
  Administered 2023-08-28: 4 mg via INTRAVENOUS
  Filled 2023-08-28: qty 2

## 2023-08-28 MED ORDER — KETOROLAC TROMETHAMINE 15 MG/ML IJ SOLN
15.0000 mg | Freq: Once | INTRAMUSCULAR | Status: AC
  Administered 2023-08-28: 15 mg via INTRAVENOUS
  Filled 2023-08-28: qty 1

## 2023-08-28 NOTE — ED Notes (Signed)
Pt requesting pain and nausea meds. Edp aware

## 2023-08-28 NOTE — ED Provider Notes (Signed)
Care of patient from Dr. Hyacinth Meeker.  This patient presented with recent chest discomfort, nausea, vomiting.  He describes positional vertigo.  He is currently awaiting second troponin.  Discharge is anticipated. Physical Exam  BP 114/64   Pulse 98   Temp 98.4 F (36.9 C) (Oral)   Resp (!) 21   Ht 5\' 11"  (1.803 m)   Wt 136.1 kg   SpO2 90%   BMI 41.84 kg/m   Physical Exam Vitals and nursing note reviewed.  Constitutional:      General: He is not in acute distress.    Appearance: He is well-developed. He is not ill-appearing, toxic-appearing or diaphoretic.  HENT:     Head: Normocephalic and atraumatic.  Eyes:     Conjunctiva/sclera: Conjunctivae normal.  Cardiovascular:     Rate and Rhythm: Normal rate and regular rhythm.     Heart sounds: No murmur heard. Pulmonary:     Effort: Pulmonary effort is normal. No tachypnea.  Chest:     Chest wall: Tenderness present.  Abdominal:     Palpations: Abdomen is soft.     Tenderness: There is no abdominal tenderness.  Musculoskeletal:        General: No swelling. Normal range of motion.     Cervical back: Normal range of motion and neck supple.  Skin:    General: Skin is warm and dry.  Neurological:     General: No focal deficit present.     Mental Status: He is alert and oriented to person, place, and time.  Psychiatric:        Mood and Affect: Mood normal.        Behavior: Behavior normal.     Procedures  Procedures  ED Course / MDM    Medical Decision Making Amount and/or Complexity of Data Reviewed Labs: ordered. Radiology: ordered.  Risk Prescription drug management.   On assessment, patient resting comfortably.  Heart rate is in the range of 100.  He does have a blood glucose of 300 today and does state that he has been urinating a lot lately.  I suspect there is some dehydration component to his recent positional vertigo.  Will provide IV fluids and meclizine here.  He has no dizziness while at rest.  Patient to  follow-up with his primary doctor for ongoing management of his diabetes.  Will send A1c today for assistance with outpatient management.  In terms of chest pain, he does have reproducible tenderness along sternum.  Possible costochondritis.  Second troponin is reassuring.  Patient saw Dr. Diona Browner 11 years ago.  He is agreeable to follow-up with cardiology.  Referral was ordered.  Patient was discharged in stable condition.       Gloris Manchester, MD 08/28/23 [phone]

## 2023-08-28 NOTE — ED Triage Notes (Signed)
Pt with right sided CP with emesis since last night. + cramps to bilateral legs. + diarrhea.  Denies any sick contacts.

## 2023-08-28 NOTE — Discharge Instructions (Addendum)
Your blood sugar was elevated today.  This is likely causing some dehydration.  You did receive IV fluids while in the emergency department.  Continue to drink plenty of water at home to maintain good hydration at home.  A prescription for medication called meclizine was sent to your pharmacy.  Take this only as needed for dizziness.  Talk to your primary care doctor about ongoing management of your diabetes.  Take ibuprofen and Tylenol as needed for pain.  A referral was sent to the cardiology office for follow-up to reestablish care.  If you do not hear from them in the next few days, call the telephone number below.  Return to the emergency department for any new or worsening symptoms of concern.

## 2023-08-28 NOTE — ED Notes (Signed)
Pt c/o n/v last night. Stated starting having pain to chest with vomiting. Pt also c/o cramping all over as well. Pt is a/o. Gen weakness noted. Color wnl. Non diaphoretic.

## 2023-09-14 DIAGNOSIS — S29019A Strain of muscle and tendon of unspecified wall of thorax, initial encounter: Secondary | ICD-10-CM | POA: Diagnosis not present

## 2023-10-27 DIAGNOSIS — E782 Mixed hyperlipidemia: Secondary | ICD-10-CM | POA: Diagnosis not present

## 2023-10-27 DIAGNOSIS — E1129 Type 2 diabetes mellitus with other diabetic kidney complication: Secondary | ICD-10-CM | POA: Diagnosis not present

## 2023-10-27 DIAGNOSIS — E1140 Type 2 diabetes mellitus with diabetic neuropathy, unspecified: Secondary | ICD-10-CM | POA: Diagnosis not present

## 2023-11-02 DIAGNOSIS — I10 Essential (primary) hypertension: Secondary | ICD-10-CM | POA: Diagnosis not present

## 2023-11-02 DIAGNOSIS — E7849 Other hyperlipidemia: Secondary | ICD-10-CM | POA: Diagnosis not present

## 2023-11-02 DIAGNOSIS — E1140 Type 2 diabetes mellitus with diabetic neuropathy, unspecified: Secondary | ICD-10-CM | POA: Diagnosis not present

## 2023-11-02 DIAGNOSIS — E782 Mixed hyperlipidemia: Secondary | ICD-10-CM | POA: Diagnosis not present

## 2024-01-31 DIAGNOSIS — Z0000 Encounter for general adult medical examination without abnormal findings: Secondary | ICD-10-CM | POA: Diagnosis not present

## 2024-01-31 DIAGNOSIS — Z23 Encounter for immunization: Secondary | ICD-10-CM | POA: Diagnosis not present

## 2024-01-31 DIAGNOSIS — I10 Essential (primary) hypertension: Secondary | ICD-10-CM | POA: Diagnosis not present

## 2024-01-31 DIAGNOSIS — E7849 Other hyperlipidemia: Secondary | ICD-10-CM | POA: Diagnosis not present

## 2024-01-31 DIAGNOSIS — E1140 Type 2 diabetes mellitus with diabetic neuropathy, unspecified: Secondary | ICD-10-CM | POA: Diagnosis not present

## 2024-01-31 DIAGNOSIS — Z1389 Encounter for screening for other disorder: Secondary | ICD-10-CM | POA: Diagnosis not present

## 2024-01-31 DIAGNOSIS — Z0001 Encounter for general adult medical examination with abnormal findings: Secondary | ICD-10-CM | POA: Diagnosis not present

## 2024-03-26 DIAGNOSIS — M1712 Unilateral primary osteoarthritis, left knee: Secondary | ICD-10-CM | POA: Diagnosis not present

## 2024-03-26 DIAGNOSIS — M25562 Pain in left knee: Secondary | ICD-10-CM | POA: Diagnosis not present

## 2024-03-29 DIAGNOSIS — E782 Mixed hyperlipidemia: Secondary | ICD-10-CM | POA: Diagnosis not present

## 2024-03-29 DIAGNOSIS — I10 Essential (primary) hypertension: Secondary | ICD-10-CM | POA: Diagnosis not present

## 2024-03-29 DIAGNOSIS — E1140 Type 2 diabetes mellitus with diabetic neuropathy, unspecified: Secondary | ICD-10-CM | POA: Diagnosis not present

## 2024-04-29 DIAGNOSIS — E1140 Type 2 diabetes mellitus with diabetic neuropathy, unspecified: Secondary | ICD-10-CM | POA: Diagnosis not present

## 2024-04-29 DIAGNOSIS — E782 Mixed hyperlipidemia: Secondary | ICD-10-CM | POA: Diagnosis not present

## 2024-04-29 DIAGNOSIS — I10 Essential (primary) hypertension: Secondary | ICD-10-CM | POA: Diagnosis not present

## 2024-05-06 DIAGNOSIS — Z7189 Other specified counseling: Secondary | ICD-10-CM | POA: Diagnosis not present

## 2024-05-06 DIAGNOSIS — E1140 Type 2 diabetes mellitus with diabetic neuropathy, unspecified: Secondary | ICD-10-CM | POA: Diagnosis not present

## 2024-05-06 DIAGNOSIS — E782 Mixed hyperlipidemia: Secondary | ICD-10-CM | POA: Diagnosis not present

## 2024-05-06 DIAGNOSIS — I10 Essential (primary) hypertension: Secondary | ICD-10-CM | POA: Diagnosis not present

## 2024-05-06 DIAGNOSIS — G2581 Restless legs syndrome: Secondary | ICD-10-CM | POA: Diagnosis not present

## 2024-05-06 DIAGNOSIS — Z636 Dependent relative needing care at home: Secondary | ICD-10-CM | POA: Diagnosis not present

## 2024-05-06 DIAGNOSIS — G4733 Obstructive sleep apnea (adult) (pediatric): Secondary | ICD-10-CM | POA: Diagnosis not present

## 2024-05-06 DIAGNOSIS — B351 Tinea unguium: Secondary | ICD-10-CM | POA: Diagnosis not present

## 2024-05-06 DIAGNOSIS — M1712 Unilateral primary osteoarthritis, left knee: Secondary | ICD-10-CM | POA: Diagnosis not present

## 2024-05-09 DIAGNOSIS — Z0001 Encounter for general adult medical examination with abnormal findings: Secondary | ICD-10-CM | POA: Diagnosis not present

## 2024-05-09 DIAGNOSIS — I10 Essential (primary) hypertension: Secondary | ICD-10-CM | POA: Diagnosis not present

## 2024-05-09 DIAGNOSIS — E7849 Other hyperlipidemia: Secondary | ICD-10-CM | POA: Diagnosis not present

## 2024-05-09 DIAGNOSIS — E1129 Type 2 diabetes mellitus with other diabetic kidney complication: Secondary | ICD-10-CM | POA: Diagnosis not present

## 2024-05-09 DIAGNOSIS — R42 Dizziness and giddiness: Secondary | ICD-10-CM | POA: Diagnosis not present

## 2024-05-09 DIAGNOSIS — E1165 Type 2 diabetes mellitus with hyperglycemia: Secondary | ICD-10-CM | POA: Diagnosis not present

## 2024-05-15 DIAGNOSIS — E782 Mixed hyperlipidemia: Secondary | ICD-10-CM | POA: Diagnosis not present

## 2024-05-15 DIAGNOSIS — I10 Essential (primary) hypertension: Secondary | ICD-10-CM | POA: Diagnosis not present

## 2024-05-15 DIAGNOSIS — Z23 Encounter for immunization: Secondary | ICD-10-CM | POA: Diagnosis not present

## 2024-05-15 DIAGNOSIS — E7849 Other hyperlipidemia: Secondary | ICD-10-CM | POA: Diagnosis not present

## 2024-05-22 DIAGNOSIS — E782 Mixed hyperlipidemia: Secondary | ICD-10-CM | POA: Diagnosis not present

## 2024-05-22 DIAGNOSIS — G2581 Restless legs syndrome: Secondary | ICD-10-CM | POA: Diagnosis not present

## 2024-05-22 DIAGNOSIS — M1712 Unilateral primary osteoarthritis, left knee: Secondary | ICD-10-CM | POA: Diagnosis not present

## 2024-05-22 DIAGNOSIS — E1140 Type 2 diabetes mellitus with diabetic neuropathy, unspecified: Secondary | ICD-10-CM | POA: Diagnosis not present

## 2024-05-22 DIAGNOSIS — B351 Tinea unguium: Secondary | ICD-10-CM | POA: Diagnosis not present

## 2024-05-22 DIAGNOSIS — Z636 Dependent relative needing care at home: Secondary | ICD-10-CM | POA: Diagnosis not present

## 2024-05-22 DIAGNOSIS — G4733 Obstructive sleep apnea (adult) (pediatric): Secondary | ICD-10-CM | POA: Diagnosis not present

## 2024-05-22 DIAGNOSIS — I10 Essential (primary) hypertension: Secondary | ICD-10-CM | POA: Diagnosis not present

## 2024-06-11 DIAGNOSIS — B351 Tinea unguium: Secondary | ICD-10-CM | POA: Diagnosis not present

## 2024-06-11 DIAGNOSIS — Z636 Dependent relative needing care at home: Secondary | ICD-10-CM | POA: Diagnosis not present

## 2024-06-11 DIAGNOSIS — G2581 Restless legs syndrome: Secondary | ICD-10-CM | POA: Diagnosis not present

## 2024-06-11 DIAGNOSIS — I10 Essential (primary) hypertension: Secondary | ICD-10-CM | POA: Diagnosis not present

## 2024-06-11 DIAGNOSIS — E782 Mixed hyperlipidemia: Secondary | ICD-10-CM | POA: Diagnosis not present

## 2024-06-11 DIAGNOSIS — G4733 Obstructive sleep apnea (adult) (pediatric): Secondary | ICD-10-CM | POA: Diagnosis not present

## 2024-06-11 DIAGNOSIS — E1140 Type 2 diabetes mellitus with diabetic neuropathy, unspecified: Secondary | ICD-10-CM | POA: Diagnosis not present

## 2024-06-11 DIAGNOSIS — M1712 Unilateral primary osteoarthritis, left knee: Secondary | ICD-10-CM | POA: Diagnosis not present

## 2024-06-21 NOTE — Progress Notes (Signed)
 New Patient Pulmonology Office Visit   Subjective:  Patient ID: Alex Bautista, male    DOB: 09-Aug-1956  MRN: [id number]  Referred by: Corlis Longs, FNP  CC:  Chief Complaint  Patient presents with   Establish Care   Sleep Apnea    Stops breathing at night / snor    HPI Alex Bautista is a 68 y.o. male with DM, GERD who presents for initial evaluation of sleep disordered breathing.  Discussed the use of AI scribe software for clinical note transcription with the patient, who gave verbal consent to proceed.  History of Present Illness Alex Bautista is a 68 year old male with sleep apnea who presents for evaluation of his condition. He was referred by Dr. Iron for evaluation of his sleep apnea.  He has a history of sleep apnea diagnosed approximately 20 years ago. He was on CPAP therapy for about five years, which he found beneficial after an initial adjustment period. However, his CPAP machine stopped working, and he has not used it for about 50 years. He experiences snoring at night and occasional daytime sleepiness, particularly when sitting in the car or watching TV. His wife reports that he stops breathing at night and sometimes has to nudge him awake. He wakes up two to three times per night to use the restroom but does not have difficulty returning to sleep. He typically goes to bed around 10 PM and wakes up at 6 AM, getting approximately eight hours of sleep, although he has a TV on in the room, which is loud due to his wife's hearing issues.  He has a history of diabetes and is currently on Mounjaro, which has helped him lose weight, now weighing 271 pounds after losing 300 pounds. He also takes a blood pressure medication for his prostate, aspirin, and Xanax. He denies any known high blood pressure but mentions taking a blood pressure pill. He has a history of asthma but does not currently use an inhaler and only experiences coughing when exposed to cold air, which resolves  quickly.  Socially, he is very busy, spending his days driving his children and grandchildren around, as none of his children, aged 30 to 24, have driver's licenses. He rents houses for a living. He does not smoke and has a penicillin allergy. He had a wood-burning stove until 20 years ago and currently has a minor mold issue at home due to a furnace problem.   STOPBANG: Snoring Loudly, Fatigue, Observed Apneas, Hypertension, BMI > 35 kg/m2, Age > 50, Neck Circumference > 16 inches or 40 cm, and Male Gender 8/8     06/24/2024    9:00 AM  Results of the Epworth flowsheet  Sitting and reading 2  Watching TV 3  Sitting, inactive in a public place (e.g. a theatre or a meeting) 0  As a passenger in a car for an hour without a break 0  Lying down to rest in the afternoon when circumstances permit 0  Sitting and talking to someone 0  Sitting quietly after a lunch without alcohol  0  In a car, while stopped for a few minutes in traffic 0  Total score 5   ROS  Allergies: Penicillins and Tape  Current Outpatient Medications:    ALPRAZolam (XANAX) 1 MG tablet, Take 1 mg by mouth 4 (four) times daily as needed. , Disp: , Rfl:    aspirin 81 MG tablet, Take 81 mg by mouth daily., Disp: , Rfl:  atorvastatin (LIPITOR) 20 MG tablet, Take 20 mg by mouth daily., Disp: , Rfl:    finasteride (PROSCAR) 5 MG tablet, Take 1 tablet by mouth Daily., Disp: , Rfl:    HYDROcodone-acetaminophen (NORCO) 10-325 MG per tablet, Take 1 tablet by mouth every 6 (six) hours as needed., Disp: , Rfl:    lisinopril (PRINIVIL,ZESTRIL) 10 MG tablet, Take 1 tablet by mouth Daily., Disp: , Rfl:    meclizine  (ANTIVERT ) 25 MG tablet, Take 1 tablet (25 mg total) by mouth 3 (three) times daily as needed for dizziness., Disp: 30 tablet, Rfl: 0   metFORMIN (GLUCOPHAGE) 500 MG tablet, Take 1,500 mg by mouth daily with breakfast. , Disp: , Rfl:    nabumetone (RELAFEN) 750 MG tablet, Take 1 tablet by mouth Twice daily., Disp: , Rfl:     NITROSTAT 0.4 MG SL tablet, Place 1 tablet under the tongue every 5 (five) minutes x 3 doses as needed., Disp: , Rfl:    omeprazole (PRILOSEC) 20 MG capsule, Take 20 mg by mouth daily., Disp: , Rfl:    terazosin (HYTRIN) 10 MG capsule, Take 10 mg by mouth at bedtime., Disp: , Rfl:  Past Medical History:  Diagnosis Date   Asthma    Essential hypertension, benign    GERD (gastroesophageal reflux disease)    OSA (obstructive sleep apnea)    S/P dilatation of esophageal stricture    Type 2 diabetes mellitus (HCC)    Past Surgical History:  Procedure Laterality Date   BACK SURGERY  1998   Right THR  2007   Family History  Problem Relation Age of Onset   Heart failure Father    Cancer Father    Valvular heart disease Mother    Social History   Socioeconomic History   Marital status: Single    Spouse name: Not on file   Number of children: Not on file   Years of education: Not on file   Highest education level: Not on file  Occupational History   Not on file  Tobacco Use   Smoking status: Never   Smokeless tobacco: Never  Substance and Sexual Activity   Alcohol  use: No   Drug use: No   Sexual activity: Not on file  Other Topics Concern   Not on file  Social History Narrative   Not on file   Social Drivers of Health   Financial Resource Strain: Not on file  Food Insecurity: Not on file  Transportation Needs: Not on file  Physical Activity: Not on file  Stress: Not on file  Social Connections: Not on file  Intimate Partner Violence: Not on file       Objective:  BP 134/75   Pulse 73   Ht 5' 11 (1.803 m)   Wt 271 lb 6.4 oz (123.1 kg)   SpO2 96% Comment: ra  BMI 37.85 kg/m  Wt Readings from Last 3 Encounters:  06/24/24 271 lb 6.4 oz (123.1 kg)  08/28/23 300 lb (136.1 kg)  04/24/12 277 lb (125.6 kg)   BMI Readings from Last 3 Encounters:  06/24/24 37.85 kg/m  08/28/23 41.84 kg/m  04/24/12 38.63 kg/m   SpO2 Readings from Last 3 Encounters:   06/24/24 96%  08/28/23 94%    Physical Exam Physical Exam MEASUREMENTS: Weight- 271. CHEST: Clear to auscultation bilaterally. CARDIOVASCULAR: Normal heart sounds. HEENT: Mallampati Score IV, neck circumference 18 inches  Diagnostic Review:  Last CBC Lab Results  Component Value Date   WBC 8.4 08/28/2023   HGB  15.5 08/28/2023   HCT 43.7 08/28/2023   MCV 81.8 08/28/2023   MCH 29.0 08/28/2023   RDW 12.9 08/28/2023   PLT 175 08/28/2023   Last metabolic panel Lab Results  Component Value Date   GLUCOSE 308 (H) 08/28/2023   NA 131 (L) 08/28/2023   K 4.2 08/28/2023   CL 99 08/28/2023   CO2 20 (L) 08/28/2023   BUN 21 08/28/2023   CREATININE 0.80 08/28/2023   GFRNONAA >60 08/28/2023   CALCIUM 9.3 08/28/2023   ANIONGAP 12 08/28/2023      Assessment & Plan:   Assessment & Plan Obstructive sleep apnea   Assessment & Plan Obstructive sleep apnea Patient has excessive daytime hypersomnolence, frequent napping, fatigue. STOP BANG is 8. ESS 5. Mallampati Score IV. Neck Circ 18 inches. He was previously diagnosed with OSA but prior sleep study outdated. Discussed home vs. in-lab study; patient prefers home study. - Order home sleep study. - If home sleep study confirms obstructive sleep apnea, initiate CPAP therapy.  Type 2 diabetes mellitus Managed with Mounjaro, contributing to significant weight loss. Continued weight management crucial. - Continue Mounjaro for diabetes management and weight loss.  Morbid obesity Weight loss from 300 to 271 pounds beneficial for diabetes and sleep apnea management. - Encourage continued weight loss efforts.  Orders Placed This Encounter  Procedures   Home sleep test   I spent 30 minutes reviewing patient's chart including prior consultant notes, imaging, and PFTs as well as face-to-face with the patient, over half in discussion of the diagnosis and the importance of compliance with the treatment plan.\  Return in about 2 months  (around 08/24/2024).   Florene Brill, MD

## 2024-06-24 ENCOUNTER — Ambulatory Visit (INDEPENDENT_AMBULATORY_CARE_PROVIDER_SITE_OTHER): Payer: PRIVATE HEALTH INSURANCE | Admitting: Pulmonary Disease

## 2024-06-24 ENCOUNTER — Encounter: Payer: Self-pay | Admitting: Pulmonary Disease

## 2024-06-24 VITALS — BP 134/75 | HR 73 | Ht 71.0 in | Wt 271.4 lb

## 2024-06-24 DIAGNOSIS — G4733 Obstructive sleep apnea (adult) (pediatric): Secondary | ICD-10-CM | POA: Diagnosis not present

## 2024-06-24 NOTE — Patient Instructions (Signed)
  VISIT SUMMARY: You came in today to discuss your sleep apnea. We talked about your history with CPAP therapy and your current symptoms. We also reviewed your diabetes management and weight loss progress.  YOUR PLAN: OBSTRUCTIVE SLEEP APNEA: Your symptoms suggest sleep apnea, and your previous sleep study is outdated. -We will order a home sleep study to confirm if you have obstructive sleep apnea. -If the home sleep study confirms obstructive sleep apnea, we will start you on CPAP therapy.  TYPE 2 DIABETES MELLITUS: Your diabetes is being managed with Mounjaro, which has also helped you lose weight. -Continue taking Mounjaro to manage your diabetes and support your weight loss.  MORBID OBESITY: You have lost weight from 300 to 271 pounds, which is beneficial for managing your diabetes and sleep apnea. -Keep up your weight loss efforts.                      Contains text generated by Abridge.                                 Contains text generated by Abridge.

## 2024-06-25 DIAGNOSIS — E782 Mixed hyperlipidemia: Secondary | ICD-10-CM | POA: Diagnosis not present

## 2024-06-25 DIAGNOSIS — M1712 Unilateral primary osteoarthritis, left knee: Secondary | ICD-10-CM | POA: Diagnosis not present

## 2024-06-25 DIAGNOSIS — I10 Essential (primary) hypertension: Secondary | ICD-10-CM | POA: Diagnosis not present

## 2024-06-25 DIAGNOSIS — B351 Tinea unguium: Secondary | ICD-10-CM | POA: Diagnosis not present

## 2024-06-25 DIAGNOSIS — Z636 Dependent relative needing care at home: Secondary | ICD-10-CM | POA: Diagnosis not present

## 2024-06-25 DIAGNOSIS — E1140 Type 2 diabetes mellitus with diabetic neuropathy, unspecified: Secondary | ICD-10-CM | POA: Diagnosis not present

## 2024-06-25 DIAGNOSIS — G4733 Obstructive sleep apnea (adult) (pediatric): Secondary | ICD-10-CM | POA: Diagnosis not present

## 2024-06-25 DIAGNOSIS — G2581 Restless legs syndrome: Secondary | ICD-10-CM | POA: Diagnosis not present

## 2024-08-06 ENCOUNTER — Ambulatory Visit (HOSPITAL_COMMUNITY)

## 2024-08-12 ENCOUNTER — Encounter

## 2024-08-12 DIAGNOSIS — G4733 Obstructive sleep apnea (adult) (pediatric): Secondary | ICD-10-CM

## 2024-08-13 ENCOUNTER — Ambulatory Visit (INDEPENDENT_AMBULATORY_CARE_PROVIDER_SITE_OTHER)

## 2024-08-13 ENCOUNTER — Encounter (HOSPITAL_COMMUNITY): Payer: Self-pay

## 2024-08-13 DIAGNOSIS — F321 Major depressive disorder, single episode, moderate: Secondary | ICD-10-CM | POA: Diagnosis not present

## 2024-08-13 DIAGNOSIS — F411 Generalized anxiety disorder: Secondary | ICD-10-CM | POA: Diagnosis not present
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
## 2024-08-26 DIAGNOSIS — G4733 Obstructive sleep apnea (adult) (pediatric): Secondary | ICD-10-CM | POA: Diagnosis not present

## 2024-08-26 DIAGNOSIS — R0683 Snoring: Secondary | ICD-10-CM | POA: Diagnosis not present

## 2024-08-27 ENCOUNTER — Ambulatory Visit (HOSPITAL_COMMUNITY)

## 2024-08-27 ENCOUNTER — Ambulatory Visit: Payer: Self-pay | Admitting: Pulmonary Disease

## 2024-08-27 DIAGNOSIS — F321 Major depressive disorder, single episode, moderate: Secondary | ICD-10-CM

## 2024-08-27 DIAGNOSIS — F411 Generalized anxiety disorder: Secondary | ICD-10-CM | POA: Diagnosis not present

## 2024-08-27 DIAGNOSIS — G4733 Obstructive sleep apnea (adult) (pediatric): Secondary | ICD-10-CM

## 2024-08-27 DIAGNOSIS — F331 Major depressive disorder, recurrent, moderate: Secondary | ICD-10-CM

## 2024-08-27 NOTE — Progress Notes (Signed)
" ° °  THERAPIST PROGRESS NOTE  Session Time: 11:01 AM - 11:59 AM - 58 minutes  Participation Level: Active  Behavioral Response: CasualAlertAnxious  Type of Therapy: Individual Therapy  Treatment Goals addressed: Building rapport, working to identify triggers of anxiety and depression, processing emotions around his wife's illness  ProgressTowards Goals: Initial  Interventions: CBT and Strength-based, supportive, motivational interviewing  Summary: Alex Bautista is a 68 y.o. male who presents with poor sleep, daily anxiety and worry, irritability I blow up at people everyday Too many people depend on me restlessness, problems concentrating and negative thinking.  He is worried about his extended family, who all rely on him for money, transportation and housing as well as his wife's health as she is bedbound.  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  He feels overwhelmed daily by concerns that he can't continue to manage this way with little support.  He tries to set limits but has difficulty with this because of his sense of responsibility to help people, but then feels resentful when he feels others are not being responsible by not working to support themselves, by doing drugs, or by  expecting to get paid to do any chores..   Suicidal/Homicidal: Nowithout intent/plan  Therapist Response: Therapist worked to scientist, forensic with Stormy during the session through active listening and a collborative, client-centered approach.  Worked on reframing negative thinking and clarifying Reynol's reasons for wanting to make changes through motivational interviewing.  Used CBT interventions to work on problem solving and and goal setting.  We discussed beginning to set limits with people in his life and Bary was able to identify several people he would most like to set limits with.   Discussed the stepwise process of setting limits and the reasons this has not worked in the past.  Waddell Iten to reframe past attempts as practice rather than failure.  Suggested homework for next time to mentally rehearse setting limits as practice in tolerating being able to say no consistently.  Plan: Return again in 2 weeks.  Diagnosis: Generalized anxiety disorder  Major depressive disorder, single episode, moderate (HCC)  Collaboration of Care: Collaboration of care not needed at this visit.  Patient/Guardian was advised Release of Information must be obtained prior to any record release in order to collaborate their care with an outside provider. Patient/Guardian was advised if they have not already done so to contact the registration department to sign all necessary forms in order for us  to release information regarding their care.   Consent: Patient/Guardian gives verbal consent for treatment and assignment of benefits for services provided during this visit. Patient/Guardian expressed understanding and agreed to proceed.   Lauraine Ferrari, LCSW 08/27/2024  "

## 2024-09-02 NOTE — Progress Notes (Signed)
 Called and relayed results pt confirmed understanding

## 2024-09-02 NOTE — Progress Notes (Deleted)
" ° °  Established Patient Pulmonology Office Visit   Subjective:  Patient ID: Alex Bautista, male    DOB: 06/25/1956  MRN: [id number]  CC: No chief complaint on file.   HPI  Alex Bautista is a 69 y.o. male with DM, GERD who presents for follow up of sleep disordered breathing.  Last seen on 06/24/2024, at the time, ordered HST, AHI 3% is 25 and AHI 4% 18, lowest oxygen desaturation was 71%.  {PULM QUESTIONNAIRES (Optional):33196}  ROS  {History (Optional):23778} Current Medications[1]      Objective:  There were no vitals taken for this visit. {Pulm Vitals (Optional):32837}  Physical Exam   Diagnostic Review:  {Labs (Optional):32838}  HST 08/12/2024: AHI 3% is 25 and AHI 4% 18, lowest oxygen desaturation was 71%.    Assessment & Plan:   Assessment & Plan   No orders of the defined types were placed in this encounter.     No follow-ups on file.   Sahana Boyland, MD    [1]  Current Outpatient Medications:    ALPRAZolam (XANAX) 1 MG tablet, Take 1 mg by mouth 4 (four) times daily as needed. , Disp: , Rfl:    aspirin 81 MG tablet, Take 81 mg by mouth daily., Disp: , Rfl:    atorvastatin (LIPITOR) 20 MG tablet, Take 20 mg by mouth daily., Disp: , Rfl:    finasteride (PROSCAR) 5 MG tablet, Take 1 tablet by mouth Daily., Disp: , Rfl:    HYDROcodone-acetaminophen (NORCO) 10-325 MG per tablet, Take 1 tablet by mouth every 6 (six) hours as needed., Disp: , Rfl:    lisinopril (PRINIVIL,ZESTRIL) 10 MG tablet, Take 1 tablet by mouth Daily., Disp: , Rfl:    meclizine  (ANTIVERT ) 25 MG tablet, Take 1 tablet (25 mg total) by mouth 3 (three) times daily as needed for dizziness., Disp: 30 tablet, Rfl: 0   metFORMIN (GLUCOPHAGE) 500 MG tablet, Take 1,500 mg by mouth daily with breakfast. , Disp: , Rfl:    nabumetone (RELAFEN) 750 MG tablet, Take 1 tablet by mouth Twice daily., Disp: , Rfl:    NITROSTAT 0.4 MG SL tablet, Place 1 tablet under the tongue every 5 (five) minutes x 3  doses as needed., Disp: , Rfl:    omeprazole (PRILOSEC) 20 MG capsule, Take 20 mg by mouth daily., Disp: , Rfl:    terazosin (HYTRIN) 10 MG capsule, Take 10 mg by mouth at bedtime., Disp: , Rfl:   "

## 2024-09-03 ENCOUNTER — Ambulatory Visit: Admitting: Pulmonary Disease

## 2024-09-10 ENCOUNTER — Ambulatory Visit (INDEPENDENT_AMBULATORY_CARE_PROVIDER_SITE_OTHER)

## 2024-09-10 DIAGNOSIS — Z91199 Patient's noncompliance with other medical treatment and regimen due to unspecified reason: Secondary | ICD-10-CM

## 2024-09-10 NOTE — Progress Notes (Signed)
 Alex Bautista did not arrive for his scheduled 9 AM appointment.

## 2024-09-12 NOTE — Telephone Encounter (Signed)
 Spoke with patient and scheduled follow up with Dr. Catherine on Wednesday 11/06/24 at 9:15 AM.  Will mail information to patient and he voiced his understanding

## 2024-09-17 ENCOUNTER — Ambulatory Visit (HOSPITAL_COMMUNITY)

## 2024-09-17 ENCOUNTER — Encounter (HOSPITAL_COMMUNITY): Payer: Self-pay

## 2024-09-17 DIAGNOSIS — F321 Major depressive disorder, single episode, moderate: Secondary | ICD-10-CM

## 2024-09-17 DIAGNOSIS — F411 Generalized anxiety disorder: Secondary | ICD-10-CM

## 2024-09-17 NOTE — Progress Notes (Addendum)
" ° °  THERAPIST PROGRESS NOTE  Session Time: 9:00 AM - 9:56 AM  Participation Level: Active  Behavioral Response: CasualAlertAnxious  Type of Therapy: Individual Therapy  Treatment Goals addressed: Work on problem solving to address anxiety, developed increasing awareness of unhealthful cognitive patterns.  ProgressTowards Goals: Progressing  Interventions: CBT, Solution Focused, Supportive, and Reframing  Summary: Alex Bautista is a 69 y.o. male who presents with poor sleep, daily anxiety and worry, irritability I blow up at people everyday Too many people depend on me restlessness, problems concentrating and negative thinking. He is worried about his extended family, who all rely on him for money, transportation and housing as well as his wife's health as she is bedbound.  Currently his wife is acutely ill but refuses to go to the ED, so he is trying to get her into her physician today.    He reported the he tried what we discussed at the last visit - starting with one person who asks him for money and say no.  He was able to do this with a bothersome tenant with success and without guilt.  He reported he changed his voicemail message to say not to leave him messages asking for loans.  He feels this is a step.  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  Says he has a home on an [place] but he hasn't been in 6 years due to work demands.  He has not taken time to enjoy the fruits of his labors.  He worries that he will get older and ill and there will be no one to take care of him.  We discussed how fear of facing feelings sometimes keeps people from changing and he agreed he thought this was something that affected him..   Suicidal/Homicidal: Nowithout intent/plan  Therapist Response: Therapist provided opportunities to work on reframing some of his thoughts about others taking advantage of him.  Helped [name]  to see his irritability and overwork in a cost/benefit analysis to improve insight. Worked with him on some problem solving on how to reduce his workload if he chooses.  Discussed risks and benefits of making change.  Plan: Return again in 2 weeks.  Diagnosis: Generalized anxiety disorder  Major depressive disorder, single episode, moderate (HCC)  Collaboration of Care: Not needed at this visit.  Patient/Guardian was advised Release of Information must be obtained prior to any record release in order to collaborate their care with an outside provider. Patient/Guardian was advised if they have not already done so to contact the registration department to sign all necessary forms in order for us  to release information regarding their care.   Consent: Patient/Guardian gives verbal consent for treatment and assignment of benefits for services provided during this visit. Patient/Guardian expressed understanding and agreed to proceed.   Lauraine Ferrari, LCSW 09/25/2024  "

## 2024-10-01 ENCOUNTER — Ambulatory Visit (HOSPITAL_COMMUNITY)

## 2024-10-08 ENCOUNTER — Ambulatory Visit (HOSPITAL_COMMUNITY)

## 2024-11-06 ENCOUNTER — Ambulatory Visit: Admitting: Pulmonary Disease
